# Patient Record
Sex: Male | Born: 1960 | Race: White | Hispanic: No | Marital: Married | Smoking: Heavy tobacco smoker
Health system: Southern US, Community
[De-identification: ages and names within clinical notes are randomized; demographics above are authoritative.]

## PROBLEM LIST (undated history)

## (undated) DIAGNOSIS — I10 Essential (primary) hypertension: Secondary | ICD-10-CM

## (undated) DIAGNOSIS — E119 Type 2 diabetes mellitus without complications: Secondary | ICD-10-CM

## (undated) DIAGNOSIS — T7840XA Allergy, unspecified, initial encounter: Secondary | ICD-10-CM

## (undated) DIAGNOSIS — J449 Chronic obstructive pulmonary disease, unspecified: Secondary | ICD-10-CM

## (undated) DIAGNOSIS — E079 Disorder of thyroid, unspecified: Secondary | ICD-10-CM

## (undated) DIAGNOSIS — I639 Cerebral infarction, unspecified: Secondary | ICD-10-CM

## (undated) DIAGNOSIS — I219 Acute myocardial infarction, unspecified: Secondary | ICD-10-CM

## (undated) DIAGNOSIS — I509 Heart failure, unspecified: Secondary | ICD-10-CM

## (undated) DIAGNOSIS — R569 Unspecified convulsions: Secondary | ICD-10-CM

## (undated) DIAGNOSIS — N189 Chronic kidney disease, unspecified: Secondary | ICD-10-CM

## (undated) DIAGNOSIS — Q2112 Patent foramen ovale: Secondary | ICD-10-CM

## (undated) DIAGNOSIS — N200 Calculus of kidney: Secondary | ICD-10-CM

## (undated) HISTORY — DX: Cerebral infarction, unspecified: I63.9

## (undated) HISTORY — PX: BACK SURGERY: SHX140

## (undated) HISTORY — DX: Heart failure, unspecified: I50.9

## (undated) HISTORY — DX: Chronic kidney disease, unspecified: N18.9

## (undated) HISTORY — DX: Acute myocardial infarction, unspecified: I21.9

## (undated) HISTORY — DX: Patent foramen ovale: Q21.12

## (undated) HISTORY — DX: Essential (primary) hypertension: I10

## (undated) HISTORY — DX: Allergy, unspecified, initial encounter: T78.40XA

## (undated) HISTORY — DX: Type 2 diabetes mellitus without complications: E11.9

## (undated) HISTORY — DX: Unspecified convulsions: R56.9

## (undated) HISTORY — DX: Disorder of thyroid, unspecified: E07.9

## (undated) HISTORY — DX: Calculus of kidney: N20.0

## (undated) HISTORY — DX: Chronic obstructive pulmonary disease, unspecified: J44.9

---

## 2001-08-29 ENCOUNTER — Encounter: Payer: Self-pay | Admitting: Urology

## 2001-08-29 ENCOUNTER — Ambulatory Visit (HOSPITAL_COMMUNITY): Admission: RE | Admit: 2001-08-29 | Discharge: 2001-08-29 | Payer: Self-pay | Admitting: Urology

## 2003-06-13 ENCOUNTER — Ambulatory Visit (HOSPITAL_COMMUNITY): Admission: RE | Admit: 2003-06-13 | Discharge: 2003-06-14 | Payer: Self-pay | Admitting: Neurosurgery

## 2008-03-08 ENCOUNTER — Ambulatory Visit: Payer: Self-pay | Admitting: Gastroenterology

## 2008-03-08 DIAGNOSIS — F17200 Nicotine dependence, unspecified, uncomplicated: Secondary | ICD-10-CM | POA: Insufficient documentation

## 2008-03-08 DIAGNOSIS — K625 Hemorrhage of anus and rectum: Secondary | ICD-10-CM | POA: Insufficient documentation

## 2008-03-08 DIAGNOSIS — R1310 Dysphagia, unspecified: Secondary | ICD-10-CM | POA: Insufficient documentation

## 2008-03-08 DIAGNOSIS — K219 Gastro-esophageal reflux disease without esophagitis: Secondary | ICD-10-CM | POA: Insufficient documentation

## 2008-05-07 ENCOUNTER — Ambulatory Visit: Payer: Self-pay | Admitting: Gastroenterology

## 2008-05-14 ENCOUNTER — Ambulatory Visit: Payer: Self-pay | Admitting: Gastroenterology

## 2010-09-25 NOTE — Op Note (Signed)
[name], [name]                           ACCOUNT NO.:  [id number]   MEDICAL RECORD NO.:  [id number]                   PATIENT TYPE:  OIB   LOCATION:  2858                                 FACILITY:  [facility]   PHYSICIAN:  Coletta Memos, M.D.                  DATE OF BIRTH:  [birth year]   DATE OF PROCEDURE:  06/13/2003  DATE OF DISCHARGE:                                 OPERATIVE REPORT   PREOPERATIVE DIAGNOSIS:  Displaced disk, L4-5, bilateral.   POSTOPERATIVE DIAGNOSIS:  Displaced disk, L4-5, bilateral.   OPERATION PERFORMED:  Bilateral hemilaminectomies, L4.  Right L4-5  diskectomy with microdissection.   SURGEON:  Coletta Memos, M.D.   ASSISTANT:  Stefani Dama, M.D.   OPERATIVE FINDINGS:  A large free fragment in two pieces removed caudal to  the disk space.   ANESTHESIA:  General.   INDICATIONS FOR PROCEDURE:  Fernando Gardner is a 50 year old gentleman with a 15  year history of back pain.  He presented to my office with severe pain in  his back and left lower extremity.  MRI showed a large herniated disk at L4-  5 which was central in location but obviously affecting both sides.  I  therefore recommended and he agreed to undergo bilateral laminectomy and  possible diskectomy at L4-5.   DESCRIPTION OF PROCEDURE:  [name] was brought to the operating room,  intubated and placed under general anesthetic without difficulty.  He was  rolled prone onto a Wilson frame and all pressure points were properly  padded.  The skin was prepped and he was draped in a sterile fashion.  __________ a preoperative localizing film.  I infiltrated 9 mL 0.5%  lidocaine with 1:200,000 strength epinephrine into the proposed incision and  paraspinous musculature on both sides.  I opened the skin with a #10 blade  and took that down to the thoracolumbar fascia.  I then exposed the lamina  bilaterally at L4 and L5.  I took an x-ray and it confirmed my location.  I  performed semihemilaminectomies  of L4 bilaterally.  I removed the ligamentum  flavum and exposed the thecal sac bilaterally.  I started on the right side,  opened the disk space and removed significant amount of disk.  With the use  of nerve hooks, I was able to retrieve two large fragments of disk material  which were caudal to the disk space which I will assume were the fragments  that I saw on the MRI.  Using the same blunt tip probe, I again probed  caudal to the disk space and was not able to find any disk material.  I also  explored with Dr. Danielle Dess, the left side and did not find any fragments that  came out.  The disk space felt rather flat considering I removed a  significant amount of disk from the disk space and had removed  two free  fragments from that caudal area.  I did not feel it was necessary to open  the disk space at L4-5.  I then irrigated the wound.  I then closed the  wound in layered fashion, using Vicryl sutures.  Subcutaneous tissue  reapproximated with 0 Vicryl sutures.  Dermabond was used for a sterile  dressing.  The patient tolerated the procedure well, awakening, moving all  extremities without difficulty.                                               Coletta Memos, M.D.   KC/MEDQ  D:  06/13/2003  T:  06/14/2003  Job:  [id number]

## 2013-09-17 ENCOUNTER — Encounter: Payer: Self-pay | Admitting: Family Medicine

## 2013-09-17 ENCOUNTER — Ambulatory Visit (INDEPENDENT_AMBULATORY_CARE_PROVIDER_SITE_OTHER): Payer: BC Managed Care – PPO | Admitting: Family Medicine

## 2013-09-17 ENCOUNTER — Ambulatory Visit (INDEPENDENT_AMBULATORY_CARE_PROVIDER_SITE_OTHER): Payer: BC Managed Care – PPO

## 2013-09-17 VITALS — BP 141/93 | HR 57 | Temp 98.8°F | Wt 181.8 lb

## 2013-09-17 DIAGNOSIS — M549 Dorsalgia, unspecified: Secondary | ICD-10-CM

## 2013-09-17 MED ORDER — CYCLOBENZAPRINE HCL 10 MG PO TABS: 10.0000 mg | ORAL_TABLET | Freq: Three times a day (TID) | ORAL | Status: DC | PRN

## 2013-09-17 MED ORDER — PREDNISONE 50 MG PO TABS
ORAL_TABLET | ORAL | Status: DC
Start: 2013-09-17 — End: 2014-05-24

## 2013-09-17 NOTE — Progress Notes (Signed)
   Subjective:    Patient ID: Fernando Gardner, male    DOB: 21-Jul-1960, 53 y.o.   MRN: [id number]  HPI Patient presents today with chief complaint of low back pain. States that he has had low back pain for the past 3-4 months. Initially to his back in February. Baseline history of lumbar disc herniation status post repair several years ago. Has had some radicular symptoms down into his right thigh. Patient works Comptroller. No bowel or bladder anesthesia. Pain is worse with back flexion and extension.    Review of Systems  All other systems reviewed and are negative.      Objective:   Physical Exam  Constitutional: He appears well-developed and well-nourished.  HENT:  Head: Normocephalic and atraumatic.  Eyes: Conjunctivae are normal. Pupils are equal, round, and reactive to light.  Neck: Normal range of motion. Neck supple.  Cardiovascular: Normal rate and regular rhythm.   Pulmonary/Chest: Effort normal and breath sounds normal.  Abdominal: Soft.  Musculoskeletal:  Mild lumbar TTP  Mild pain with lumbar flexion and extension Neurovascularly intact distally    Neurological: He is alert.  Skin: Skin is warm.  [facility] reading (PRIMARY) by  Dr. Ernestina Patches  L spine xray preliminarily negative for any fracture or dislocation                              Assessment & Plan:  Back pain - Plan: DG Lumbar Spine Complete, predniSONE (DELTASONE) 50 MG tablet, cyclobenzaprine (FLEXERIL) 10 MG tablet   Likely mild lumbar strain  Xrays preliminarily WNL  Will place on short course of prednisone and flexeril.  Discussed general care and [facility] red flags  Follow up as needed.

## 2013-09-24 ENCOUNTER — Telehealth: Payer: Self-pay | Admitting: Family Medicine

## 2013-09-24 DIAGNOSIS — M549 Dorsalgia, unspecified: Secondary | ICD-10-CM

## 2013-09-25 NOTE — Telephone Encounter (Signed)
That is fine.  Please place referral to ortho.  Thank you

## 2013-09-26 NOTE — Telephone Encounter (Signed)
Referral put in.

## 2013-10-11 ENCOUNTER — Encounter: Payer: Self-pay | Admitting: Gastroenterology

## 2013-11-15 ENCOUNTER — Other Ambulatory Visit: Payer: Self-pay | Admitting: Neurosurgery

## 2013-11-15 DIAGNOSIS — M5416 Radiculopathy, lumbar region: Secondary | ICD-10-CM

## 2013-11-26 ENCOUNTER — Other Ambulatory Visit: Payer: Self-pay | Admitting: Neurosurgery

## 2013-11-26 DIAGNOSIS — Z139 Encounter for screening, unspecified: Secondary | ICD-10-CM

## 2013-11-28 ENCOUNTER — Ambulatory Visit (INDEPENDENT_AMBULATORY_CARE_PROVIDER_SITE_OTHER): Payer: BC Managed Care – PPO

## 2013-11-28 DIAGNOSIS — M5126 Other intervertebral disc displacement, lumbar region: Secondary | ICD-10-CM

## 2013-11-28 DIAGNOSIS — Z139 Encounter for screening, unspecified: Secondary | ICD-10-CM

## 2013-11-28 DIAGNOSIS — M5416 Radiculopathy, lumbar region: Secondary | ICD-10-CM

## 2013-11-28 MED ORDER — GADOBENATE DIMEGLUMINE 529 MG/ML IV SOLN: 17.0000 mL | Freq: Once | INTRAVENOUS | Status: AC | PRN

## 2014-05-24 ENCOUNTER — Ambulatory Visit (INDEPENDENT_AMBULATORY_CARE_PROVIDER_SITE_OTHER): Payer: BLUE CROSS/BLUE SHIELD | Admitting: Family Medicine

## 2014-05-24 ENCOUNTER — Encounter: Payer: Self-pay | Admitting: Family Medicine

## 2014-05-24 VITALS — BP 147/95 | HR 68 | Temp 97.5°F | Ht 73.0 in | Wt 191.8 lb

## 2014-05-24 DIAGNOSIS — N200 Calculus of kidney: Secondary | ICD-10-CM

## 2014-05-24 LAB — POCT UA - MICROSCOPIC ONLY
Bacteria, U Microscopic: NEGATIVE
Casts, Ur, LPF, POC: NEGATIVE
Crystals, Ur, HPF, POC: NEGATIVE
Mucus, UA: NEGATIVE
WBC, Ur, HPF, POC: NEGATIVE
Yeast, UA: NEGATIVE

## 2014-05-24 LAB — POCT URINALYSIS DIPSTICK
Bilirubin, UA: NEGATIVE
Glucose, UA: NEGATIVE
Ketones, UA: NEGATIVE
Leukocytes, UA: NEGATIVE
Nitrite, UA: NEGATIVE
Protein, UA: NEGATIVE
Spec Grav, UA: <=1.005
Urobilinogen, UA: NEGATIVE
pH, UA: 6

## 2014-05-24 MED ORDER — TAMSULOSIN HCL 0.4 MG PO CAPS: 0.4000 mg | ORAL_CAPSULE | Freq: Every day | ORAL | Status: DC

## 2014-05-24 MED ORDER — HYDROCODONE-ACETAMINOPHEN 5-325 MG PO TABS: 1.0000 | ORAL_TABLET | Freq: Four times a day (QID) | ORAL | Status: DC | PRN

## 2014-05-24 NOTE — Progress Notes (Signed)
   Subjective:    Patient ID: Fernando Gardner, male    DOB: 1961/03/28, 54 y.o.   MRN: [id number]  HPI C/o possible kidney stone sx's.  He is having severe left abdominal discomfort and bloody urine.  He was seen in the ED a month ago and was told he has 71mm left kidney stone and needed a urology referral.  Review of Systems  Constitutional: Negative for fever.  HENT: Negative for ear pain.   Eyes: Negative for discharge.  Respiratory: Negative for cough.   Cardiovascular: Negative for chest pain.  Gastrointestinal: Negative for abdominal distention.  Endocrine: Negative for polyuria.  Genitourinary: Negative for difficulty urinating.  Musculoskeletal: Negative for gait problem and neck pain.  Skin: Negative for color change and rash.  Neurological: Negative for speech difficulty and headaches.  Psychiatric/Behavioral: Negative for agitation.       Objective:    There were no vitals taken for this visit. Physical Exam  Constitutional: He is oriented to person, place, and time. He appears well-developed and well-nourished.  HENT:  Head: Normocephalic and atraumatic.  Mouth/Throat: Oropharynx is clear and moist.  Eyes: Pupils are equal, round, and reactive to light.  Neck: Normal range of motion. Neck supple.  Cardiovascular: Normal rate and regular rhythm.   No murmur heard. Pulmonary/Chest: Effort normal and breath sounds normal.  Abdominal: Soft. Bowel sounds are normal. There is no tenderness.  Neurological: He is alert and oriented to person, place, and time.  Skin: Skin is warm and dry.  Psychiatric: He has a normal mood and affect.          Assessment & Plan:     ICD-9-CM ICD-10-CM   1. Kidney stones 592.0 N20.0 POCT urinalysis dipstick     POCT UA - Microscopic Only     No Follow-up on file.  Lysbeth Penner FNP

## 2014-08-12 ENCOUNTER — Encounter: Payer: Self-pay | Admitting: Gastroenterology

## 2014-09-12 ENCOUNTER — Other Ambulatory Visit: Payer: Self-pay | Admitting: Family Medicine

## 2015-06-02 ENCOUNTER — Encounter: Payer: Self-pay | Admitting: Gastroenterology

## 2015-07-27 IMAGING — CR DG LUMBAR SPINE COMPLETE 4+V
2 series · 2 of 2 positions shown · non-contrast
Comparison: None.

CLINICAL DATA: Back pain

EXAM:
LUMBAR SPINE - COMPLETE 4+ VIEW

[view not recorded (1 of 2)]
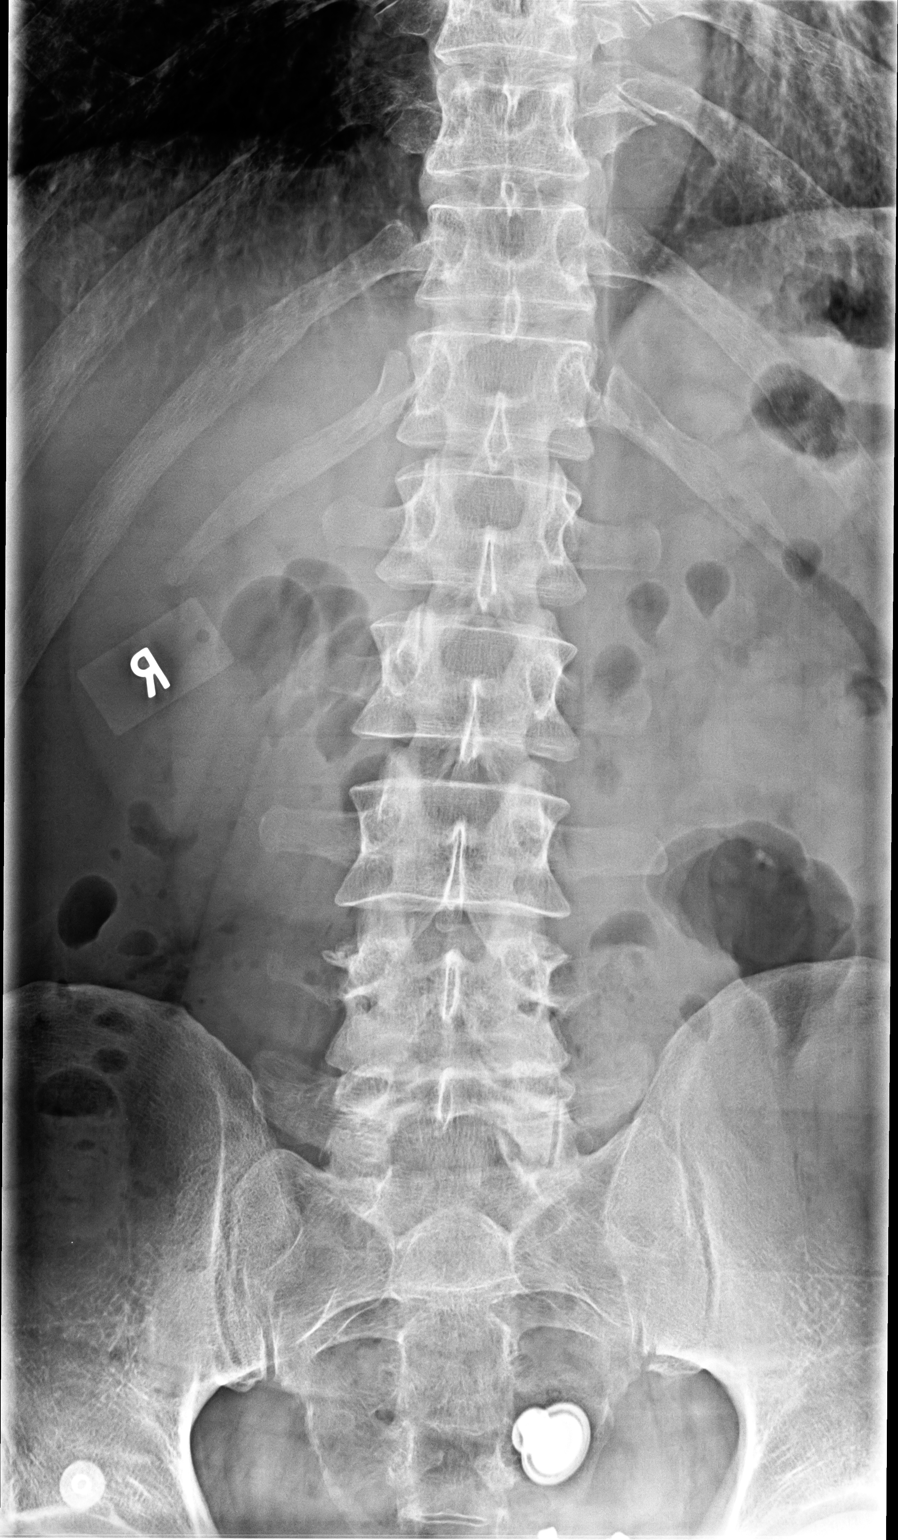

[view not recorded (2 of 2)]
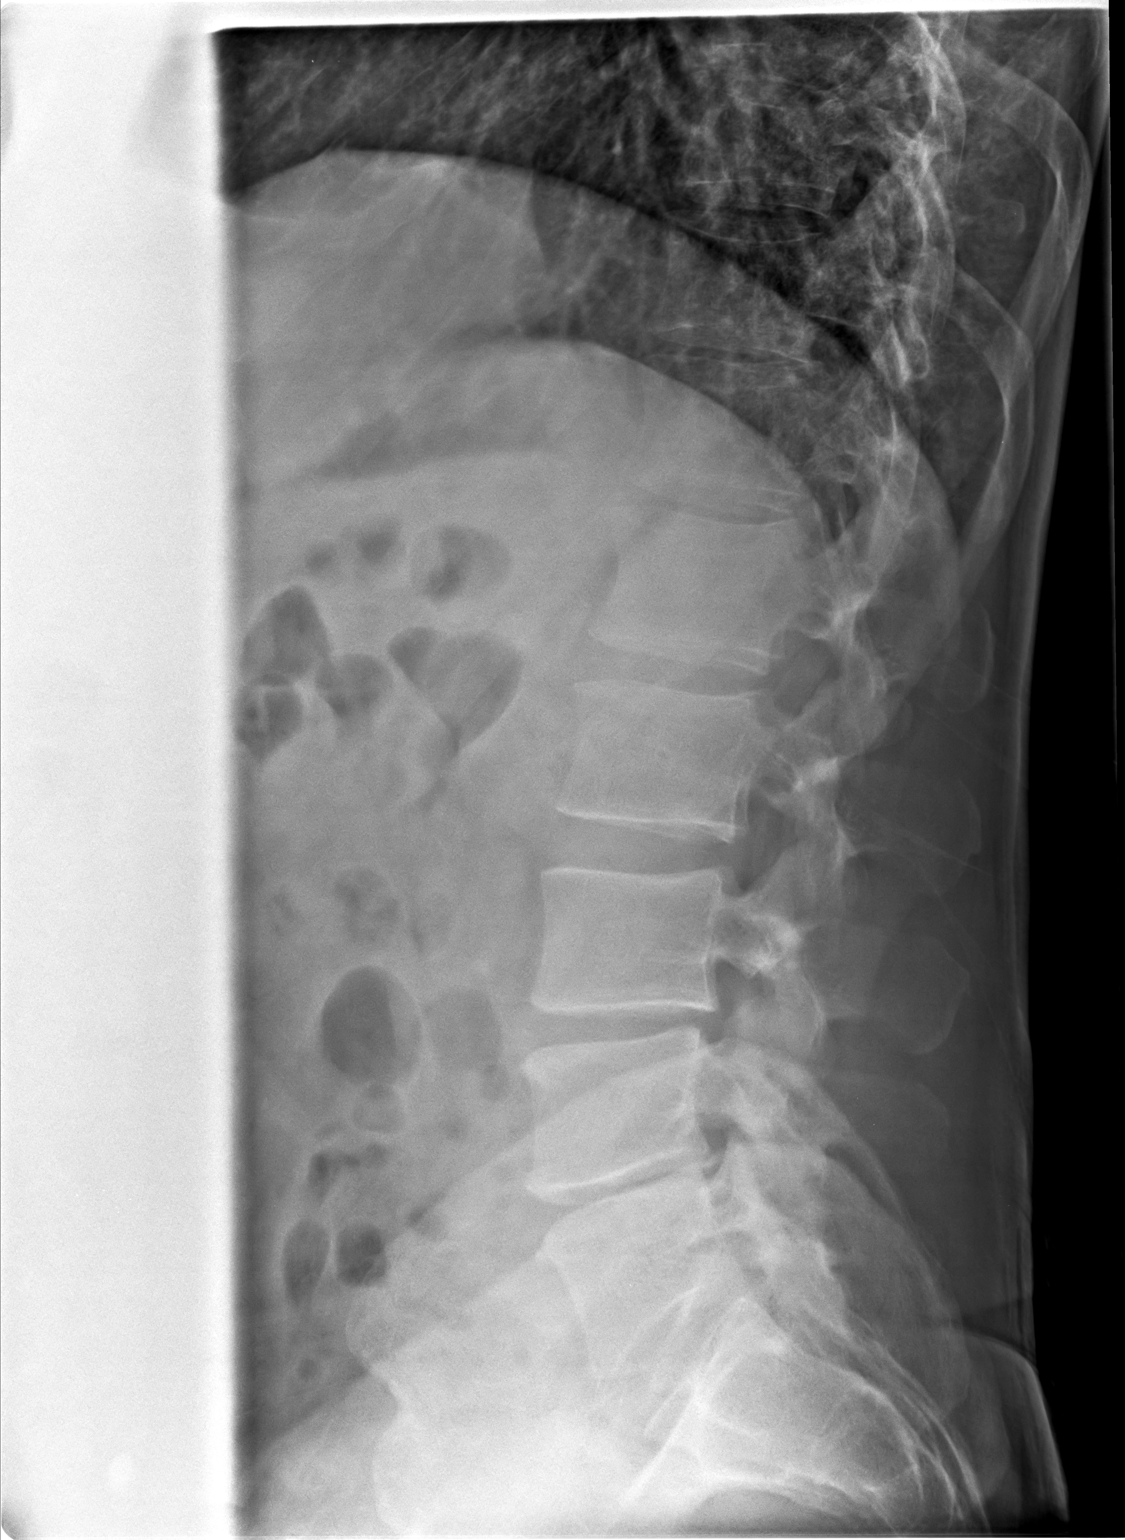

[2 of 2 positions shown; findings below may reference images not displayed]

FINDINGS: Two views of lumbar spine submitted. No acute fracture or
subluxation. There is disc space flattening with mild anterior
spurring at L4-L5 level. Minimal disc space flattening at L5-S1
level. Mild disc space flattening at L3-L4 level.
IMPRESSION: No acute fracture or subluxation. Mild degenerative changes as
described above.

## 2016-04-30 DIAGNOSIS — H578 Other specified disorders of eye and adnexa: Secondary | ICD-10-CM | POA: Diagnosis not present

## 2016-10-02 DIAGNOSIS — R319 Hematuria, unspecified: Secondary | ICD-10-CM | POA: Diagnosis not present

## 2016-10-02 DIAGNOSIS — N200 Calculus of kidney: Secondary | ICD-10-CM | POA: Diagnosis not present

## 2016-10-02 DIAGNOSIS — R109 Unspecified abdominal pain: Secondary | ICD-10-CM | POA: Diagnosis not present

## 2016-10-06 DIAGNOSIS — N201 Calculus of ureter: Secondary | ICD-10-CM | POA: Diagnosis not present

## 2016-10-06 DIAGNOSIS — C7492 Malignant neoplasm of unspecified part of left adrenal gland: Secondary | ICD-10-CM | POA: Diagnosis not present

## 2016-10-06 DIAGNOSIS — N202 Calculus of kidney with calculus of ureter: Secondary | ICD-10-CM | POA: Diagnosis not present

## 2016-10-06 DIAGNOSIS — R1084 Generalized abdominal pain: Secondary | ICD-10-CM | POA: Diagnosis not present

## 2016-10-06 DIAGNOSIS — C7491 Malignant neoplasm of unspecified part of right adrenal gland: Secondary | ICD-10-CM | POA: Diagnosis not present

## 2016-10-06 DIAGNOSIS — N132 Hydronephrosis with renal and ureteral calculous obstruction: Secondary | ICD-10-CM | POA: Diagnosis not present

## 2016-10-06 DIAGNOSIS — R109 Unspecified abdominal pain: Secondary | ICD-10-CM | POA: Diagnosis not present

## 2016-10-06 DIAGNOSIS — Z87442 Personal history of urinary calculi: Secondary | ICD-10-CM | POA: Diagnosis not present

## 2016-10-12 DIAGNOSIS — Z8673 Personal history of transient ischemic attack (TIA), and cerebral infarction without residual deficits: Secondary | ICD-10-CM | POA: Diagnosis not present

## 2016-10-12 DIAGNOSIS — N132 Hydronephrosis with renal and ureteral calculous obstruction: Secondary | ICD-10-CM | POA: Diagnosis not present

## 2016-10-12 DIAGNOSIS — N201 Calculus of ureter: Secondary | ICD-10-CM | POA: Diagnosis not present

## 2016-10-12 DIAGNOSIS — N200 Calculus of kidney: Secondary | ICD-10-CM | POA: Diagnosis not present

## 2016-10-12 DIAGNOSIS — Z79899 Other long term (current) drug therapy: Secondary | ICD-10-CM | POA: Diagnosis not present

## 2016-10-12 DIAGNOSIS — K219 Gastro-esophageal reflux disease without esophagitis: Secondary | ICD-10-CM | POA: Diagnosis not present

## 2016-10-12 DIAGNOSIS — F17210 Nicotine dependence, cigarettes, uncomplicated: Secondary | ICD-10-CM | POA: Diagnosis not present

## 2016-10-20 DIAGNOSIS — N200 Calculus of kidney: Secondary | ICD-10-CM | POA: Diagnosis not present

## 2016-11-19 DIAGNOSIS — Z466 Encounter for fitting and adjustment of urinary device: Secondary | ICD-10-CM | POA: Diagnosis not present

## 2016-11-19 DIAGNOSIS — N200 Calculus of kidney: Secondary | ICD-10-CM | POA: Diagnosis not present

## 2016-11-19 DIAGNOSIS — E278 Other specified disorders of adrenal gland: Secondary | ICD-10-CM | POA: Diagnosis not present

## 2016-11-19 DIAGNOSIS — N132 Hydronephrosis with renal and ureteral calculous obstruction: Secondary | ICD-10-CM | POA: Diagnosis not present

## 2016-11-19 DIAGNOSIS — N1330 Unspecified hydronephrosis: Secondary | ICD-10-CM | POA: Diagnosis not present

## 2016-11-24 DIAGNOSIS — N201 Calculus of ureter: Secondary | ICD-10-CM | POA: Diagnosis not present

## 2016-11-24 DIAGNOSIS — Z960 Presence of urogenital implants: Secondary | ICD-10-CM | POA: Diagnosis not present

## 2016-11-24 DIAGNOSIS — N200 Calculus of kidney: Secondary | ICD-10-CM | POA: Diagnosis not present

## 2016-12-09 DIAGNOSIS — I088 Other rheumatic multiple valve diseases: Secondary | ICD-10-CM | POA: Diagnosis not present

## 2016-12-09 DIAGNOSIS — R911 Solitary pulmonary nodule: Secondary | ICD-10-CM | POA: Diagnosis not present

## 2016-12-09 DIAGNOSIS — K219 Gastro-esophageal reflux disease without esophagitis: Secondary | ICD-10-CM | POA: Diagnosis not present

## 2016-12-09 DIAGNOSIS — R931 Abnormal findings on diagnostic imaging of heart and coronary circulation: Secondary | ICD-10-CM | POA: Diagnosis not present

## 2016-12-09 DIAGNOSIS — I639 Cerebral infarction, unspecified: Secondary | ICD-10-CM | POA: Diagnosis not present

## 2016-12-09 DIAGNOSIS — Z79899 Other long term (current) drug therapy: Secondary | ICD-10-CM | POA: Diagnosis not present

## 2016-12-09 DIAGNOSIS — R29701 NIHSS score 1: Secondary | ICD-10-CM | POA: Diagnosis not present

## 2016-12-09 DIAGNOSIS — I63512 Cerebral infarction due to unspecified occlusion or stenosis of left middle cerebral artery: Secondary | ICD-10-CM | POA: Diagnosis not present

## 2016-12-09 DIAGNOSIS — R531 Weakness: Secondary | ICD-10-CM | POA: Diagnosis not present

## 2016-12-09 DIAGNOSIS — Z87442 Personal history of urinary calculi: Secondary | ICD-10-CM | POA: Diagnosis not present

## 2016-12-09 DIAGNOSIS — R29898 Other symptoms and signs involving the musculoskeletal system: Secondary | ICD-10-CM | POA: Diagnosis not present

## 2016-12-09 DIAGNOSIS — R29818 Other symptoms and signs involving the nervous system: Secondary | ICD-10-CM | POA: Diagnosis not present

## 2016-12-09 DIAGNOSIS — R9082 White matter disease, unspecified: Secondary | ICD-10-CM | POA: Diagnosis not present

## 2016-12-09 DIAGNOSIS — R202 Paresthesia of skin: Secondary | ICD-10-CM | POA: Diagnosis not present

## 2016-12-10 DIAGNOSIS — R931 Abnormal findings on diagnostic imaging of heart and coronary circulation: Secondary | ICD-10-CM | POA: Diagnosis not present

## 2016-12-10 DIAGNOSIS — I088 Other rheumatic multiple valve diseases: Secondary | ICD-10-CM | POA: Diagnosis not present

## 2016-12-10 DIAGNOSIS — I639 Cerebral infarction, unspecified: Secondary | ICD-10-CM | POA: Diagnosis not present

## 2016-12-10 DIAGNOSIS — R911 Solitary pulmonary nodule: Secondary | ICD-10-CM | POA: Diagnosis not present

## 2016-12-15 ENCOUNTER — Ambulatory Visit (INDEPENDENT_AMBULATORY_CARE_PROVIDER_SITE_OTHER): Payer: BLUE CROSS/BLUE SHIELD | Admitting: Family Medicine

## 2016-12-15 ENCOUNTER — Encounter (INDEPENDENT_AMBULATORY_CARE_PROVIDER_SITE_OTHER): Payer: Self-pay

## 2016-12-15 ENCOUNTER — Encounter: Payer: Self-pay | Admitting: Family Medicine

## 2016-12-15 VITALS — BP 134/85 | HR 54 | Temp 97.2°F | Ht 73.0 in | Wt 190.0 lb

## 2016-12-15 DIAGNOSIS — Z8673 Personal history of transient ischemic attack (TIA), and cerebral infarction without residual deficits: Secondary | ICD-10-CM

## 2016-12-15 DIAGNOSIS — Z09 Encounter for follow-up examination after completed treatment for conditions other than malignant neoplasm: Secondary | ICD-10-CM | POA: Diagnosis not present

## 2016-12-16 ENCOUNTER — Encounter: Payer: Self-pay | Admitting: Family Medicine

## 2016-12-16 DIAGNOSIS — I63512 Cerebral infarction due to unspecified occlusion or stenosis of left middle cerebral artery: Secondary | ICD-10-CM | POA: Diagnosis not present

## 2016-12-16 DIAGNOSIS — Z720 Tobacco use: Secondary | ICD-10-CM | POA: Diagnosis not present

## 2016-12-16 DIAGNOSIS — E785 Hyperlipidemia, unspecified: Secondary | ICD-10-CM | POA: Diagnosis not present

## 2016-12-16 DIAGNOSIS — I10 Essential (primary) hypertension: Secondary | ICD-10-CM | POA: Diagnosis not present

## 2016-12-19 NOTE — Progress Notes (Signed)
BP 134/85   Pulse (!) 54   Temp (!) 97.2 F (36.2 C) (Oral)   Ht 6\' 1"  (1.854 m)   Wt 190 lb (86.2 kg)   BMI 25.07 kg/m    Subjective:    Patient ID: Fernando Gardner, male    DOB: 06/15/1960, 56 y.o.   MRN: [id number]  HPI: [name] is a 56 y.o. male presenting on 12/15/2016 for Hospitalization Follow-up (pt here today following up after being admitted at [facility] for TIA.)   HPI Hospital follow-up for TIA Patient is coming in today for hospital follow-up for TIA. He was admitted on 12/09/2016. He was found to have a TIA at that point which then resolved quickly and he denies any residual symptoms. He went to the hospital with symptoms of right arm and leg numbness and weakness. He says those are completely resolves. He denied any speech difficulties or facial difficulties at the time or since. He does admit that he is still smoking but is down to half a pack a day from over 1-1/2 packs per day.  Relevant past medical, surgical, family and social history reviewed and updated as indicated. Interim medical history since our last visit reviewed. Allergies and medications reviewed and updated.  Review of Systems  Constitutional: Negative for chills and fever.  Eyes: Negative for discharge.  Respiratory: Negative for shortness of breath and wheezing.   Cardiovascular: Negative for chest pain and leg swelling.  Musculoskeletal: Negative for back pain and gait problem.  Skin: Negative for rash.  Neurological: Positive for weakness and numbness. Negative for dizziness and light-headedness.  All other systems reviewed and are negative.   Per HPI unless specifically indicated above   Allergies as of 12/15/2016   No Known Allergies     Medication List       Accurate as of 12/15/16 11:59 PM. Always use your most recent med list.          aspirin 81 MG chewable tablet Chew 81 mg by mouth every morning.   atorvastatin 40 MG tablet Commonly known as:  LIPITOR Take 40 mg by mouth  at bedtime.   Potassium Citrate 15 MEQ (1620 MG) Tbcr Take 30 mEq by mouth 2 (two) times daily.          Objective:    BP 134/85   Pulse (!) 54   Temp (!) 97.2 F (36.2 C) (Oral)   Ht 6\' 1"  (1.854 m)   Wt 190 lb (86.2 kg)   BMI 25.07 kg/m   Wt Readings from Last 3 Encounters:  12/15/16 190 lb (86.2 kg)  05/24/14 191 lb 12.8 oz (87 kg)  09/17/13 181 lb 12.8 oz (82.5 kg)    Physical Exam  Constitutional: He is oriented to person, place, and time. He appears well-developed and well-nourished. No distress.  Eyes: Conjunctivae are normal. No scleral icterus.  Cardiovascular: Normal rate, regular rhythm, normal heart sounds and intact distal pulses.   No murmur heard. Pulmonary/Chest: Effort normal and breath sounds normal. No respiratory distress. He has no wheezes.  Musculoskeletal: Normal range of motion. He exhibits no edema.  Neurological: He is alert and oriented to person, place, and time. He has normal strength. No cranial nerve deficit or sensory deficit. He exhibits normal muscle tone. Coordination and gait normal.  Skin: Skin is warm and dry. No rash noted. He is not diaphoretic.  Psychiatric: He has a normal mood and affect. His behavior is normal.  Nursing note and vitals  reviewed.       Assessment & Plan:   Problem List Items Addressed This Visit    None    Visit Diagnoses    History of TIA (transient ischemic attack)    [facility] discharge follow-up         As the computer systems are down currently I'm unable to visualize his hospital follow-up labs at this point but he does have follow-up with the stroke clinic tomorrow and they will manage more closely.   He has been started on aspirin and Lipitor and potassium at the hospital.  Follow up plan: Return if symptoms worsen or fail to improve.  Counseling provided for all of the vaccine components No orders of the defined types were placed in this encounter.   Caryl Pina,  MD [facility] 12/15/2016, 4:48 PM

## 2017-01-03 ENCOUNTER — Encounter: Payer: Self-pay | Admitting: Family Medicine

## 2017-01-03 ENCOUNTER — Ambulatory Visit (INDEPENDENT_AMBULATORY_CARE_PROVIDER_SITE_OTHER): Payer: BLUE CROSS/BLUE SHIELD | Admitting: Family Medicine

## 2017-01-03 VITALS — BP 125/83 | HR 57 | Temp 97.6°F | Ht 73.0 in | Wt 192.0 lb

## 2017-01-03 DIAGNOSIS — M7712 Lateral epicondylitis, left elbow: Secondary | ICD-10-CM | POA: Diagnosis not present

## 2017-01-03 MED ORDER — METHYLPREDNISOLONE ACETATE 80 MG/ML IJ SUSP
40.0000 mg | Freq: Once | INTRAMUSCULAR | Status: AC
  Administered 2017-01-03: 40 mg via INTRAMUSCULAR

## 2017-01-03 NOTE — Progress Notes (Signed)
BP 125/83 (BP Location: Left Arm)   Pulse (!) 57   Temp 97.6 F (36.4 C) (Oral)   Ht 6\' 1"  (1.854 m)   Wt 192 lb (87.1 kg)   BMI 25.33 kg/m    Subjective:    Patient ID: Fernando Gardner, male    DOB: Mar 06, 1961, 56 y.o.   MRN: [id number]  HPI: Fernando Gardner is a 56 y.o. male presenting on 01/03/2017 for Hospitalization Follow-up (CVA / stroke) and left elbow pain (wants injection)   HPI Left lateral elbow pain Patient has been having continued left lateral leg pain is been going on for at least a month and a half to 2 months. He says it hurts more when he driving and has his arm extended position regional for the will. He says it does hurt on the lateral aspect of the arm. 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 He denies any fevers or chills or numbness or weakness. He has been trying conservative measures including both NSAIDs and arm bands but does not feel like it's gotten any better with those.  Relevant past medical, surgical, family and social history reviewed and updated as indicated. Interim medical history since our last visit reviewed. Allergies and medications reviewed and updated.  Review of Systems  Constitutional: Negative for chills and fever.  Respiratory: Negative for shortness of breath and wheezing.   Cardiovascular: Negative for chest pain and leg swelling.  Musculoskeletal: Positive for arthralgias. Negative for back pain and gait problem.  Skin: Negative for color change and rash.  All other systems reviewed and are negative.   Per HPI unless specifically indicated above        Objective:    BP 125/83 (BP Location: Left Arm)   Pulse (!) 57   Temp 97.6 F (36.4 C) (Oral)   Ht 6\' 1"  (1.854 m)   Wt 192 lb (87.1 kg)   BMI 25.33 kg/m   Wt Readings from Last 3 Encounters:  01/03/17 192 lb (87.1 kg)  12/15/16 190 lb (86.2 kg)  05/24/14 191 lb 12.8 oz (87 kg)    Physical Exam    Constitutional: He is oriented to person, place, and time. He appears well-developed and well-nourished. No distress.  Eyes: Conjunctivae are normal. No scleral icterus.  Cardiovascular: Normal rate, regular rhythm, normal heart sounds and intact distal pulses.   No murmur heard. Pulmonary/Chest: Effort normal and breath sounds normal. No respiratory distress. He has no wheezes.  Musculoskeletal: Normal range of motion. He exhibits no edema.       Left elbow: He exhibits normal range of motion, no swelling and no effusion. Tenderness found. Lateral epicondyle tenderness noted.  Neurological: He is alert and oriented to person, place, and time. Coordination normal.  Skin: Skin is warm and dry. No rash noted. He is not diaphoretic.  Psychiatric: He has a normal mood and affect. His behavior is normal.  Nursing note and vitals reviewed.   Left lateral epicondylitis injection: Consent form signed. Risk factors of bleeding and infection discussed with patient and patient is agreeable towards injection. Patient prepped with Betadine. Lateral approach towards injection used. Injected 80 mg of Depo-Medrol and 1 mL of 2% lidocaine. Patient tolerated procedure well and no side effects from noted. Minimal to no bleeding. Simple bandage applied after.     Assessment & Plan:   Problem List Items Addressed This Visit    None  Visit Diagnoses    Lateral epicondylitis of left elbow    -  Primary   Relevant Medications   methylPREDNISolone acetate (DEPO-MEDROL) injection 40 mg (Start on 01/03/2017  3:30 PM)       Follow up plan: Return if symptoms worsen or fail to improve.  Counseling provided for all of the vaccine components No orders of the defined types were placed in this encounter.   Caryl Pina, MD [facility] 01/03/2017, 3:20 PM

## 2017-02-02 ENCOUNTER — Other Ambulatory Visit: Payer: Self-pay | Admitting: Family Medicine

## 2017-02-02 NOTE — Telephone Encounter (Signed)
Seen 12/15/16  Dr Dettinger  No lipid in [employer]

## 2017-03-02 ENCOUNTER — Other Ambulatory Visit: Payer: Self-pay | Admitting: Family Medicine

## 2017-06-30 DIAGNOSIS — I10 Essential (primary) hypertension: Secondary | ICD-10-CM | POA: Diagnosis not present

## 2017-06-30 DIAGNOSIS — N200 Calculus of kidney: Secondary | ICD-10-CM | POA: Diagnosis not present

## 2017-07-12 DIAGNOSIS — N200 Calculus of kidney: Secondary | ICD-10-CM | POA: Diagnosis not present

## 2018-03-02 ENCOUNTER — Encounter: Payer: Self-pay | Admitting: Family Medicine

## 2018-03-02 ENCOUNTER — Ambulatory Visit: Payer: BLUE CROSS/BLUE SHIELD | Admitting: Family Medicine

## 2018-03-02 VITALS — BP 155/92 | HR 55 | Temp 97.4°F | Ht 73.0 in | Wt 188.8 lb

## 2018-03-02 DIAGNOSIS — R0781 Pleurodynia: Secondary | ICD-10-CM

## 2018-03-02 DIAGNOSIS — J410 Simple chronic bronchitis: Secondary | ICD-10-CM | POA: Diagnosis not present

## 2018-03-02 MED ORDER — CYCLOBENZAPRINE HCL 10 MG PO TABS: 10.0000 mg | ORAL_TABLET | Freq: Three times a day (TID) | ORAL | 0 refills | Status: DC | PRN

## 2018-03-02 MED ORDER — BENZONATATE 100 MG PO CAPS: 100.0000 mg | ORAL_CAPSULE | Freq: Three times a day (TID) | ORAL | 0 refills | Status: DC | PRN

## 2018-03-02 NOTE — Progress Notes (Signed)
BP (!) 155/92   Pulse (!) 55   Temp (!) 97.4 F (36.3 C) (Oral)   Ht 6\' 1"  (1.854 m)   Wt 188 lb 12.8 oz (85.6 kg)   BMI 24.91 kg/m    Subjective:    Patient ID: Fernando Gardner, male    DOB: [birth year], 57 y.o.   MRN: [id number]  HPI: Fernando Gardner is a 57 y.o. male presenting on 03/02/2018 for Cough (Patient states he coughs all the time from smoking and thinks he pulled a muscle yesterday from coughing.) and Flank Pain (right)   HPI Cough and congestion and rib pain Patient is coming in with complaints of cough and congestion and just developed yesterday where he was coughing so much she feels like he pulled a muscle on his right side the lower ribs and upper abdomen.  He says the pain is worsened by rotating and movement and coughing and deep breathing.  He denies any pain associated with eating or nausea or vomiting.  His cough has been the same that it always is and he does get these coughing spells especially in the morning when he wakes up.  He denies any fevers or chills or shortness of breath or wheezing.  Patient does admit that he still smoking a pack and 1/2 to 2 packs daily and he feels like he does not want to quit because he had both of his TIAs during time periods when he quit smoking  Relevant past medical, surgical, family and social history reviewed and updated as indicated. Interim medical history since our last visit reviewed. Allergies and medications reviewed and updated.  Review of Systems  Constitutional: Negative for chills and fever.  HENT: Positive for congestion.   Eyes: Negative for visual disturbance.  Respiratory: Positive for cough. Negative for shortness of breath and wheezing.   Cardiovascular: Negative for chest pain and leg swelling.  Musculoskeletal: Positive for myalgias. Negative for back pain and gait problem.  Skin: Negative for color change and rash.  All other systems reviewed and are negative.   Per HPI unless specifically indicated  above   Allergies as of 03/02/2018   No Known Allergies     Medication List        Accurate as of 03/02/18 10:48 AM. Always use your most recent med list.          benzonatate 100 MG capsule Commonly known as:  TESSALON Take 1 capsule (100 mg total) by mouth 3 (three) times daily as needed for cough.   cyclobenzaprine 10 MG tablet Commonly known as:  FLEXERIL Take 1 tablet (10 mg total) by mouth 3 (three) times daily as needed for muscle spasms.   Potassium Citrate 15 MEQ (1620 MG) Tbcr Take 30 mEq by mouth 2 (two) times daily.          Objective:    BP (!) 155/92   Pulse (!) 55   Temp (!) 97.4 F (36.3 C) (Oral)   Ht 6\' 1"  (1.854 m)   Wt 188 lb 12.8 oz (85.6 kg)   BMI 24.91 kg/m   Wt Readings from Last 3 Encounters:  03/02/18 188 lb 12.8 oz (85.6 kg)  01/03/17 192 lb (87.1 kg)  12/15/16 190 lb (86.2 kg)    Physical Exam  Constitutional: He is oriented to person, place, and time. He appears well-developed and well-nourished. No distress.  Eyes: Conjunctivae are normal. No scleral icterus.  Neck: Neck supple.  Cardiovascular: Normal rate, regular rhythm, normal  heart sounds and intact distal pulses.  No murmur heard. Pulmonary/Chest: Effort normal and breath sounds normal. No respiratory distress. He has no wheezes.  Musculoskeletal: He exhibits tenderness (Right lower rib pain).  Lymphadenopathy:    He has no cervical adenopathy.  Neurological: He is alert and oriented to person, place, and time. Coordination normal.  Skin: Skin is warm and dry. No rash noted. He is not diaphoretic.  Psychiatric: He has a normal mood and affect. His behavior is normal.  Nursing note and vitals reviewed.     Assessment & Plan:   Problem List Items Addressed This Visit    None    Visit Diagnoses    Simple chronic bronchitis ([place])    -  Primary   Relevant Medications   benzonatate (TESSALON PERLES) 100 MG capsule   cyclobenzaprine (FLEXERIL) 10 MG tablet   Rib pain  on right side       Likely muscular strain from all the coughing.   Relevant Medications   benzonatate (TESSALON PERLES) 100 MG capsule   cyclobenzaprine (FLEXERIL) 10 MG tablet      Gave sample for Incruse Ellipta and a muscle relaxer and Tessalon Perles, likely his COPD, patient has no desire to quit smoking at this point Follow up plan: Return if symptoms worsen or fail to improve.  Counseling provided for all of the vaccine components No orders of the defined types were placed in this encounter.   Caryl Pina, MD [facility] 03/02/2018, 10:48 AM

## 2018-12-06 ENCOUNTER — Encounter: Payer: Self-pay | Admitting: Family

## 2018-12-06 ENCOUNTER — Other Ambulatory Visit: Payer: Self-pay

## 2018-12-06 ENCOUNTER — Ambulatory Visit: Payer: BLUE CROSS/BLUE SHIELD | Admitting: Family

## 2018-12-06 VITALS — BP 149/95 | HR 51 | Temp 97.9°F | Ht 73.0 in | Wt 185.8 lb

## 2018-12-06 DIAGNOSIS — M5442 Lumbago with sciatica, left side: Secondary | ICD-10-CM | POA: Diagnosis not present

## 2018-12-06 MED ORDER — METHYLPREDNISOLONE ACETATE 80 MG/ML IJ SUSP
80.0000 mg | Freq: Once | INTRAMUSCULAR | Status: AC
  Administered 2018-12-06: 80 mg via INTRAMUSCULAR

## 2018-12-06 MED ORDER — DICLOFENAC SODIUM 75 MG PO TBEC: 75.0000 mg | DELAYED_RELEASE_TABLET | Freq: Two times a day (BID) | ORAL | 0 refills | Status: DC

## 2018-12-06 MED ORDER — KETOROLAC TROMETHAMINE 60 MG/2ML IM SOLN
60.0000 mg | Freq: Once | INTRAMUSCULAR | Status: AC
  Administered 2018-12-06: 17:00:00 60 mg via INTRAMUSCULAR

## 2018-12-06 MED ORDER — BACLOFEN 10 MG PO TABS: 10.0000 mg | ORAL_TABLET | Freq: Three times a day (TID) | ORAL | 0 refills | Status: DC

## 2018-12-06 NOTE — Patient Instructions (Signed)
Acute Back Pain, Adult Acute back pain is sudden and usually short-lived. It is often caused by an injury to the muscles and tissues in the back. The injury may result from:  A muscle or ligament getting overstretched or torn (strained). Ligaments are tissues that connect bones to each other. Lifting something improperly can cause a back strain.  Wear and tear (degeneration) of the spinal disks. Spinal disks are circular tissue that provides cushioning between the bones of the spine (vertebrae).  Twisting motions, such as while playing sports or doing yard work.  A hit to the back.  Arthritis. You may have a physical exam, lab tests, and imaging tests to find the cause of your pain. Acute back pain usually goes away with rest and home care. Follow these instructions at home: Managing pain, stiffness, and swelling  Take over-the-counter and prescription medicines only as told by your health care provider.  Your health care provider may recommend applying ice during the first 24-48 hours after your pain starts. To do this: ? Put ice in a plastic bag. ? Place a towel between your skin and the bag. ? Leave the ice on for 20 minutes, 2-3 times a day.  If directed, apply heat to the affected area as often as told by your health care provider. Use the heat source that your health care provider recommends, such as a moist heat pack or a heating pad. ? Place a towel between your skin and the heat source. ? Leave the heat on for 20-30 minutes. ? Remove the heat if your skin turns bright red. This is especially important if you are unable to feel pain, heat, or cold. You have a greater risk of getting burned. Activity   Do not stay in bed. Staying in bed for more than 1-2 days can delay your recovery.  Sit up and stand up straight. Avoid leaning forward when you sit, or hunching over when you stand. ? If you work at a desk, sit close to it so you do not need to lean over. Keep your chin tucked  in. Keep your neck drawn back, and keep your elbows bent at a right angle. Your arms should look like the letter "L." ? Sit high and close to the steering wheel when you drive. Add lower back (lumbar) support to your car seat, if needed.  Take short walks on even surfaces as soon as you are able. Try to increase the length of time you walk each day.  Do not sit, drive, or stand in one place for more than 30 minutes at a time. Sitting or standing for long periods of time can put stress on your back.  Do not drive or use heavy machinery while taking prescription pain medicine.  Use proper lifting techniques. When you bend and lift, use positions that put less stress on your back: ? Bend your knees. ? Keep the load close to your body. ? Avoid twisting.  Exercise regularly as told by your health care provider. Exercising helps your back heal faster and helps prevent back injuries by keeping muscles strong and flexible.  Work with a physical therapist to make a safe exercise program, as recommended by your health care provider. Do any exercises as told by your physical therapist. Lifestyle  Maintain a healthy weight. Extra weight puts stress on your back and makes it difficult to have good posture.  Avoid activities or situations that make you feel anxious or stressed. Stress and anxiety increase muscle   tension and can make back pain worse. Learn ways to manage anxiety and stress, such as through exercise. General instructions  Sleep on a firm mattress in a comfortable position. Try lying on your side with your knees slightly bent. If you lie on your back, put a pillow under your knees.  Follow your treatment plan as told by your health care provider. This may include: ? Cognitive or behavioral therapy. ? Acupuncture or massage therapy. ? Meditation or yoga. Contact a health care provider if:  You have pain that is not relieved with rest or medicine.  You have increasing pain going down  into your legs or buttocks.  Your pain does not improve after 2 weeks.  You have pain at night.  You lose weight without trying.  You have a fever or chills. Get help right away if:  You develop new bowel or bladder control problems.  You have unusual weakness or numbness in your arms or legs.  You develop nausea or vomiting.  You develop abdominal pain.  You feel faint. Summary  Acute back pain is sudden and usually short-lived.  Use proper lifting techniques. When you bend and lift, use positions that put less stress on your back.  Take over-the-counter and prescription medicines and apply heat or ice as directed by your health care provider. This information is not intended to replace advice given to you by your health care provider. Make sure you discuss any questions you have with your health care provider. Document Released: 04/26/2005 Document Revised: 08/15/2018 Document Reviewed: 12/08/2016 Elsevier Patient Education  2020 Elsevier Inc.  

## 2018-12-06 NOTE — Progress Notes (Signed)
Subjective:    Patient ID: Fernando Gardner, male    DOB: [birth year], 58 y.o.   MRN: [id number]  Chief Complaint  Patient presents with  . Back Pain   Pt presents to the office today with lower back pain that started this weekend after he was riding tractor for 2 1/2 hours.  Back Pain This is a new problem. The current episode started in the past 7 days. The problem occurs constantly. The problem is unchanged. The pain is present in the lumbar spine. The quality of the pain is described as aching. The pain radiates to the left thigh. The pain is at a severity of 10/10. The pain is moderate. The symptoms are aggravated by bending and twisting. Associated symptoms include leg pain. Pertinent negatives include no bladder incontinence or bowel incontinence. He has tried bed rest, NSAIDs and ice for the symptoms. The treatment provided mild relief.      Review of Systems  Gastrointestinal: Negative for bowel incontinence.  Genitourinary: Negative for bladder incontinence.  Musculoskeletal: Positive for back pain.  All other systems reviewed and are negative.      Objective:   Physical Exam Vitals signs reviewed.  Constitutional:      General: He is not in acute distress.    Appearance: He is well-developed.  HENT:     Head: Normocephalic.  Eyes:     General:        Right eye: No discharge.        Left eye: No discharge.     Pupils: Pupils are equal, round, and reactive to light.  Neck:     Musculoskeletal: Normal range of motion and neck supple.     Thyroid: No thyromegaly.  Cardiovascular:     Rate and Rhythm: Normal rate and regular rhythm.     Heart sounds: Normal heart sounds. No murmur.  Pulmonary:     Effort: Pulmonary effort is normal. No respiratory distress.     Breath sounds: Normal breath sounds. No wheezing.  Abdominal:     General: Bowel sounds are normal. There is no distension.     Palpations: Abdomen is soft.     Tenderness: There is no abdominal tenderness.   Musculoskeletal:        General: Tenderness present.     Comments: Pain in lumbar with flexion  Skin:    General: Skin is warm and dry.     Findings: No erythema or rash.  Neurological:     Mental Status: He is alert and oriented to person, place, and time.     Cranial Nerves: No cranial nerve deficit.     Deep Tendon Reflexes: Reflexes are normal and symmetric.  Psychiatric:        Behavior: Behavior normal.        Thought Content: Thought content normal.        Judgment: Judgment normal.       BP (!) 149/95   Pulse (!) 51   Temp 97.9 F (36.6 C) (Oral)   Ht 6\' 1"  (1.854 m)   Wt 185 lb 12.8 oz (84.3 kg)   BMI 24.51 kg/m      Assessment & Plan:  Fernando Gardner comes in today with chief complaint of Back Pain   Diagnosis and orders addressed:  1. Acute left-sided low back pain with left-sided sciatica Rest Ice ROM exercises encouraged Sedation precautions discussed No other NSAID's while taking Diclofenac RTO if symptoms worsen or do not improve  -  methylPREDNISolone acetate (DEPO-MEDROL) injection 80 mg - ketorolac (TORADOL) injection 60 mg - baclofen (LIORESAL) 10 MG tablet; Take 1 tablet (10 mg total) by mouth 3 (three) times daily.  Dispense: 30 each; Refill: 0 - diclofenac (VOLTAREN) 75 MG EC tablet; Take 1 tablet (75 mg total) by mouth 2 (two) times daily.  Dispense: 30 tablet; Refill: 0  Evelina Dun, FNP

## 2019-04-16 ENCOUNTER — Telehealth: Payer: Self-pay | Admitting: Family Medicine

## 2019-04-16 NOTE — Telephone Encounter (Signed)
No answer, no voicemail.

## 2019-04-16 NOTE — Telephone Encounter (Signed)
Patient states he no longer needs to speak with [place].

## 2019-04-17 ENCOUNTER — Other Ambulatory Visit: Payer: Self-pay

## 2019-04-17 DIAGNOSIS — Z20822 Contact with and (suspected) exposure to covid-19: Secondary | ICD-10-CM

## 2019-04-18 LAB — NOVEL CORONAVIRUS, NAA: SARS-CoV-2, NAA: NOT DETECTED

## 2019-08-31 DIAGNOSIS — R519 Headache, unspecified: Secondary | ICD-10-CM | POA: Diagnosis not present

## 2019-08-31 DIAGNOSIS — R6883 Chills (without fever): Secondary | ICD-10-CM | POA: Diagnosis not present

## 2019-08-31 DIAGNOSIS — R05 Cough: Secondary | ICD-10-CM | POA: Diagnosis not present

## 2019-08-31 DIAGNOSIS — Z6825 Body mass index (BMI) 25.0-25.9, adult: Secondary | ICD-10-CM | POA: Diagnosis not present

## 2020-04-14 DIAGNOSIS — Z20822 Contact with and (suspected) exposure to covid-19: Secondary | ICD-10-CM | POA: Diagnosis not present

## 2020-05-06 DIAGNOSIS — F17210 Nicotine dependence, cigarettes, uncomplicated: Secondary | ICD-10-CM | POA: Diagnosis not present

## 2020-05-06 DIAGNOSIS — M79662 Pain in left lower leg: Secondary | ICD-10-CM | POA: Diagnosis not present

## 2020-05-06 DIAGNOSIS — I809 Phlebitis and thrombophlebitis of unspecified site: Secondary | ICD-10-CM | POA: Diagnosis not present

## 2020-05-10 DIAGNOSIS — Z720 Tobacco use: Secondary | ICD-10-CM | POA: Diagnosis not present

## 2020-05-10 DIAGNOSIS — E785 Hyperlipidemia, unspecified: Secondary | ICD-10-CM | POA: Diagnosis not present

## 2020-05-10 DIAGNOSIS — I824Z3 Acute embolism and thrombosis of unspecified deep veins of distal lower extremity, bilateral: Secondary | ICD-10-CM | POA: Diagnosis not present

## 2020-05-10 DIAGNOSIS — Z7982 Long term (current) use of aspirin: Secondary | ICD-10-CM | POA: Diagnosis not present

## 2020-05-10 DIAGNOSIS — Z960 Presence of urogenital implants: Secondary | ICD-10-CM | POA: Diagnosis not present

## 2020-05-10 DIAGNOSIS — Z8673 Personal history of transient ischemic attack (TIA), and cerebral infarction without residual deficits: Secondary | ICD-10-CM | POA: Insufficient documentation

## 2020-05-10 DIAGNOSIS — I82451 Acute embolism and thrombosis of right peroneal vein: Secondary | ICD-10-CM | POA: Diagnosis not present

## 2020-05-10 DIAGNOSIS — L03116 Cellulitis of left lower limb: Secondary | ICD-10-CM | POA: Diagnosis not present

## 2020-05-10 DIAGNOSIS — I82409 Acute embolism and thrombosis of unspecified deep veins of unspecified lower extremity: Secondary | ICD-10-CM | POA: Diagnosis not present

## 2020-05-10 DIAGNOSIS — R918 Other nonspecific abnormal finding of lung field: Secondary | ICD-10-CM | POA: Diagnosis not present

## 2020-05-10 DIAGNOSIS — F1010 Alcohol abuse, uncomplicated: Secondary | ICD-10-CM | POA: Diagnosis not present

## 2020-05-10 DIAGNOSIS — K219 Gastro-esophageal reflux disease without esophagitis: Secondary | ICD-10-CM | POA: Diagnosis not present

## 2020-05-10 DIAGNOSIS — I82463 Acute embolism and thrombosis of calf muscular vein, bilateral: Secondary | ICD-10-CM | POA: Diagnosis not present

## 2020-05-10 DIAGNOSIS — F17210 Nicotine dependence, cigarettes, uncomplicated: Secondary | ICD-10-CM | POA: Diagnosis not present

## 2020-05-10 DIAGNOSIS — I82412 Acute embolism and thrombosis of left femoral vein: Secondary | ICD-10-CM | POA: Diagnosis not present

## 2020-05-10 DIAGNOSIS — N200 Calculus of kidney: Secondary | ICD-10-CM | POA: Diagnosis not present

## 2020-05-10 DIAGNOSIS — Z66 Do not resuscitate: Secondary | ICD-10-CM | POA: Diagnosis not present

## 2020-05-10 DIAGNOSIS — I82812 Embolism and thrombosis of superficial veins of left lower extremities: Secondary | ICD-10-CM | POA: Diagnosis not present

## 2020-05-10 DIAGNOSIS — I82492 Acute embolism and thrombosis of other specified deep vein of left lower extremity: Secondary | ICD-10-CM | POA: Diagnosis not present

## 2020-05-10 DIAGNOSIS — I82433 Acute embolism and thrombosis of popliteal vein, bilateral: Secondary | ICD-10-CM | POA: Diagnosis not present

## 2020-05-10 DIAGNOSIS — I82890 Acute embolism and thrombosis of other specified veins: Secondary | ICD-10-CM | POA: Diagnosis not present

## 2020-05-10 DIAGNOSIS — Z9889 Other specified postprocedural states: Secondary | ICD-10-CM | POA: Diagnosis not present

## 2020-05-10 DIAGNOSIS — N400 Enlarged prostate without lower urinary tract symptoms: Secondary | ICD-10-CM | POA: Diagnosis not present

## 2020-05-15 ENCOUNTER — Other Ambulatory Visit: Payer: Self-pay

## 2020-05-15 ENCOUNTER — Ambulatory Visit (INDEPENDENT_AMBULATORY_CARE_PROVIDER_SITE_OTHER): Payer: BC Managed Care – PPO | Admitting: Nurse Practitioner

## 2020-05-15 ENCOUNTER — Ambulatory Visit (HOSPITAL_COMMUNITY)
Admission: RE | Admit: 2020-05-15 | Discharge: 2020-05-15 | Disposition: A | Discharge status: Home / Self Care | Payer: BC Managed Care – PPO | Source: Ambulatory Visit | Attending: Nurse Practitioner | Admitting: Nurse Practitioner

## 2020-05-15 ENCOUNTER — Encounter: Payer: Self-pay | Admitting: Nurse Practitioner

## 2020-05-15 VITALS — BP 136/89 | HR 67 | Temp 98.1°F | Resp 20 | Ht 73.0 in | Wt 186.0 lb

## 2020-05-15 DIAGNOSIS — I824Y3 Acute embolism and thrombosis of unspecified deep veins of proximal lower extremity, bilateral: Secondary | ICD-10-CM

## 2020-05-15 DIAGNOSIS — I82412 Acute embolism and thrombosis of left femoral vein: Secondary | ICD-10-CM | POA: Diagnosis not present

## 2020-05-15 DIAGNOSIS — I82432 Acute embolism and thrombosis of left popliteal vein: Secondary | ICD-10-CM | POA: Diagnosis not present

## 2020-05-15 DIAGNOSIS — I82890 Acute embolism and thrombosis of other specified veins: Secondary | ICD-10-CM | POA: Diagnosis not present

## 2020-05-15 DIAGNOSIS — I82812 Embolism and thrombosis of superficial veins of left lower extremities: Secondary | ICD-10-CM | POA: Diagnosis not present

## 2020-05-15 NOTE — Patient Instructions (Signed)

## 2020-05-15 NOTE — Addendum Note (Signed)
Addended by: Bennie Pierini on: 05/15/2020 04:42 PM   Modules accepted: Orders

## 2020-05-15 NOTE — Progress Notes (Signed)
   Subjective:    Patient ID: Fernando Gardner, male    DOB: November 09, 1960, 60 y.o.   MRN: [id number]   Chief Complaint: Left leg swelling   HPI Patient was admitted to the hospital on 05/06/20 with DVT bil lower ext. This is no discharge summary in computer to review. They gave him heparin in the hospital and discharged him on eliquis. He says yesterday his left leg started swelling in upper groin area. Has red spots in legs which are hard to touch. He denies any SOB.   Review of Systems  Constitutional: Negative for diaphoresis.  Eyes: Negative for pain.  Respiratory: Negative for shortness of breath.   Cardiovascular: Negative for chest pain, palpitations and leg swelling.  Gastrointestinal: Negative for abdominal pain.  Endocrine: Negative for polydipsia.  Skin: Negative for rash.  Neurological: Negative for dizziness, weakness and headaches.  Hematological: Does not bruise/bleed easily.  All other systems reviewed and are negative.      Objective:   Physical Exam Vitals and nursing note reviewed.  Constitutional:      Appearance: Normal appearance. He is well-developed and well-nourished.  HENT:     Mouth/Throat:     Mouth: Oropharynx is clear and moist.  Eyes:     Extraocular Movements: EOM normal.  Neck:     Thyroid: No thyroid mass or thyromegaly.     Vascular: No carotid bruit or JVD.     Trachea: Phonation normal.  Cardiovascular:     Rate and Rhythm: Normal rate and regular rhythm.  Pulmonary:     Effort: Pulmonary effort is normal. No respiratory distress.     Breath sounds: Normal breath sounds.  Abdominal:     General: Aorta is normal.     Tenderness: There is no abdominal tenderness.  Musculoskeletal:        General: Normal range of motion.     Comments: Left groin edema  Lymphadenopathy:     Cervical: No cervical adenopathy.  Skin:    General: Skin is warm and dry.  Neurological:     Mental Status: He is alert and oriented to person, place, and time.   Psychiatric:        Mood and Affect: Mood and affect normal.        Behavior: Behavior normal.        Thought Content: Thought content normal.        Judgment: Judgment normal.    BP 136/89   Pulse 67   Temp 98.1 F (36.7 C) (Temporal)   Resp 20   Ht 6\' 1"  (1.854 m)   Wt 186 lb (84.4 kg)   SpO2 98%   BMI 24.54 kg/m         Assessment & Plan:  in today with chief complaint of Left leg swelling   1. Acute deep vein thrombosis (DVT) of proximal vein of both lower extremities (HCC) Will talk  Once have results back Hospital records reviewed - Fernando Gardner Venous Img Lower Unilateral Left; Future    The above assessment and management plan was discussed with the patient. The patient verbalized understanding of and has agreed to the management plan. Patient is aware to call the clinic if symptoms persist or worsen. Patient is aware when to return to the clinic for a follow-up visit. Patient educated on when it is appropriate to go to the emergency department.   Mary-Margaret [place], FNP

## 2020-05-21 ENCOUNTER — Other Ambulatory Visit (HOSPITAL_COMMUNITY): Payer: Self-pay | Admitting: Vascular Surgery

## 2020-05-21 ENCOUNTER — Ambulatory Visit (INDEPENDENT_AMBULATORY_CARE_PROVIDER_SITE_OTHER): Payer: BC Managed Care – PPO | Admitting: Physician Assistant

## 2020-05-21 ENCOUNTER — Other Ambulatory Visit: Payer: Self-pay

## 2020-05-21 ENCOUNTER — Ambulatory Visit (HOSPITAL_COMMUNITY)
Admission: RE | Admit: 2020-05-21 | Discharge: 2020-05-21 | Disposition: A | Discharge status: Home / Self Care | Payer: BC Managed Care – PPO | Source: Ambulatory Visit | Attending: Vascular Surgery | Admitting: Vascular Surgery

## 2020-05-21 VITALS — BP 125/87 | HR 78 | Temp 98.5°F | Resp 18 | Ht 73.0 in | Wt 180.0 lb

## 2020-05-21 DIAGNOSIS — R600 Localized edema: Secondary | ICD-10-CM

## 2020-05-21 DIAGNOSIS — I82812 Embolism and thrombosis of superficial veins of left lower extremities: Secondary | ICD-10-CM

## 2020-05-21 DIAGNOSIS — I82403 Acute embolism and thrombosis of unspecified deep veins of lower extremity, bilateral: Secondary | ICD-10-CM

## 2020-05-21 NOTE — Progress Notes (Signed)
Requested by:  Chevis Pretty, [place],  [place] [address]  Reason for consultation: bilateral lower extremity DVT   History of Present Illness   Fernando Gardner is a 60 y.o. (1961/03/01) male who presents for evaluation of bilateral lower extremity DVT. Patient was admitted to [facility] on 05/10/20-05/11/20 after presenting to the ED with bilateral lower extremity pain. He was initially diagnosed [facility] with thrombophlebitis several days prior and continued to have pain and difficulty ambulating so he returned to ER at [facility]. A duplex at [facility] showed RLE with occlusive DVT of the popliteal, peroneal and gastroc veins, LLE sub occlusive DVT of the CFV, occlusive and nonocclusive thrombus of the GSV, popliteal and gastroc veins. He explains that he was having flu like symptoms including fever, chills, muscle aches prior to all this starting. He was never tested for COVID or the Flu at the time. But due to being sick he had several days of sedentary activity otherwise he does not report any injury or inciting factors. At [facility] he was treated overnight with Heparin drip and was discharge on Eliquis. He was instructed to elevate and purchase compression stockings  He was seen in follow up by his PCP on 05/16/19 with new symptoms of swelling and pain in left thigh and leg. His PCP ordered a repeat duplex due to new symptoms. Duplex showed partially compressible CF vein with wall thickening consistent with residual thrombus extending into the SFJ. Normal compressibility of femoral vein and thrombus in the popliteal vein as well as the SSV. The RLE was not imaged beyond the CF vein for comparison. No DVT was identified.   Today he reports that on Monday he developed a swollen painful area on his right foot. This is very painful to walk on. Both legs are still painful and tender especially when ambulating. He says the left has burning, tingling and tightness. The right is more sore  in the foot where it is red and swollen. He has been compliant with his Eliquis. He also states he has been elevating. He has not yet purchased compression stockings.   He reports that he works in [employer] (medical) and so normally by end of day he has swelling in his legs but this resolves easily with elevation. He has no history of DVT. Possible family history in brother of DVT. He does not describe any claudication symptoms or rest pain prior to this event. He denies any fever or chills, chest pain or shortness of breath   Past Medical History:  Diagnosis Date  . Allergy   . CHF (congestive heart failure) ([place])   . Chronic kidney disease   . CVA (cerebral vascular accident) ([place])   . Diabetes mellitus without complication ([place])   . Hypertension   . Myocardial infarction ([place])   . Seizures ([place])   . Stroke [facility])    patient stated he has had many mini-strokes  . Thyroid disease     Past Surgical History:  Procedure Laterality Date  . BACK SURGERY     Lower back 2003/2004    Social History   Socioeconomic History  . Marital status: Married    Spouse name: Not on file  . Number of children: Not on file  . Years of education: Not on file  . Highest education level: Not on file  Occupational History  . Not on file  Tobacco Use  . Smoking status: Heavy Tobacco Smoker    Packs/day:  1.50    Types: Cigarettes  . Smokeless tobacco: Current User  Vaping Use  . Vaping Use: Never used  Substance and Sexual Activity  . Alcohol use: Yes    Alcohol/week: 2.0 standard drinks    Types: 2 Cans of beer per week  . Drug use: No  . Sexual activity: Not on file  Other Topics Concern  . Not on file  Social History Narrative  . Not on file   Social Determinants of Health   Financial Resource Strain: Not on file  Food Insecurity: Not on file  Transportation Needs: Not on file  Physical Activity: Not on file  Stress: Not on file  Social Connections: Not on file   Intimate Partner Violence: Not on file    Family History  Problem Relation Age of Onset  . Heart disease Father   . Hypertension Father   . Diabetes Brother   . Kidney disease Maternal Aunt   . Kidney disease Maternal Uncle     Current Outpatient Medications  Medication Sig Dispense Refill  . apixaban (ELIQUIS) 5 MG TABS tablet 10 mg (2 tablets) oral 2 times a day x 7 days followed by 5 mg (1 tablet) 2 times a day for completion of therapy duration.    Marland Kitchen linezolid (ZYVOX) 600 MG tablet Take 600 mg by mouth 2 (two) times daily.     No current facility-administered medications for this visit.    No Known Allergies  REVIEW OF SYSTEMS (negative unless checked):   Cardiac:  []  Chest pain or chest pressure? []  Shortness of breath upon activity? []  Shortness of breath when lying flat? []  Irregular heart rhythm?  Vascular:  []  Pain in calf, thigh, or hip brought on by walking? []  Pain in feet at night that wakes you up from your sleep? [x]  Blood clot in your veins? [x]  Leg swelling?  Pulmonary:  []  Oxygen at home? []  Productive cough? []  Wheezing?  Neurologic:  []  Sudden weakness in arms or legs? []  Sudden numbness in arms or legs? []  Sudden onset of difficult speaking or slurred speech? []  Temporary loss of vision in one eye? []  Problems with dizziness?  Gastrointestinal:  []  Blood in stool? []  Vomited blood?  Genitourinary:  []  Burning when urinating? []  Blood in urine?  Psychiatric:  []  Major depression  Hematologic:  []  Bleeding problems? []  Problems with blood clotting?  Dermatologic:  []  Rashes or ulcers?  Constitutional:  []  Fever or chills?  Ear/Nose/Throat:  []  Change in hearing? []  Nose bleeds? []  Sore throat?  Musculoskeletal:  []  Back pain? []  Joint pain? []  Muscle pain?   Physical Examination     Vitals:   05/21/20 1248  BP: 125/87  Pulse: 78  Resp: 18  Temp: 98.5 F (36.9 C)  TempSrc: Temporal  SpO2: 99%  Weight: 180  lb (81.6 kg)  Height: 6\' 1"  (1.854 m)   Body mass index is 23.75 kg/m.  General:  WDWN in NAD; vital signs documented above Gait: Normal HENT: WNL, normocephalic Pulmonary: normal non-labored breathing , without wheezing Cardiac: regular HR, without  Murmurs without carotid bruit Abdomen: soft, NT, no masses Vascular Exam/Pulses:  Right Left  Radial 2+ (normal) 2+ (normal)  Femoral 2+ (normal) 2+ (normal)  Popliteal 2+ (normal) 2+ (normal)  DP 2+ (normal) 2+ (normal)  PT 2+ (normal) 2+ (normal)   Extremities: with varicose veins, without reticular veins, without edema, without stasis pigmentation, without lipodermatosclerosis, without ulcers  Right medial ankle erythematous, warm to palpation  and very tender  Left thigh and leg with thrombosed superficial veins with hyperpigmentation of overlying skin, tender to palpation. No erythema Musculoskeletal: no muscle wasting or atrophy  Neurologic: A&O X 3;  No focal weakness or paresthesias are detected Psychiatric:  The pt has Normal affect.  Non-invasive Vascular Imaging   BLE Venous Insufficiency Duplex   US Venous Lower Extremity Bilateral Result Date: 05/10/2020 TECHNIQUE: The veins of both lower extremities were interrogated from the visible common femoral vein to the distal popliteal vein. The junction of the greater saphenous with the common femoral vein as well as the posterior tibial veins were evaluated. Gray scale, color, spectral, and doppler sonography was utilized and analyzed. COMPARISON: Bilateral lower extremity DVT study 12/10/2016 INDICATION: LE swelling/pain, r/o DVT, FINDINGS: Right lower extremity: Positive for occlusive DVT within the popliteal, peroneal, and gastrocnemius veins. Left lower extremity: Positive for subocclusive thrombus within the left common femoral vein, occlusive and nonocclusive thrombus in the greater saphenous vein, and occlusive thrombus within the popliteal and gastrocnemius veins.    IMPRESSION: Bilateral lower extremity DVT as described above. ###CODE CRITICAL REPORT### (automated ordering provider notification pathway initiated at 05/10/2020 1:01 PM) Electronically Signed by: Verdie Drown  LLE venous duplex 05/16/19: IMPRESSION: 1. Positive for acute occlusive DVT in the popliteal vein. 2. There is associated superficial venous thrombosis in the small saphenous vein. 3. Positive for nonocclusive DVT in the common femoral vein extending into the saphenofemoral junction.  BLE venous duplex 05/21/20: RIGHT:  - There is no evidence of deep vein thrombosis in the lower extremity.  - The small saphenous vein is thrombosed with no color or spectral doppler flow observed.    LEFT:  - Findings consistent with continued acute deep vein thrombosis involving the left popliteal vein and gastroc veins.  - The great saphenous vein is thrombosed.    Medical Decision Making   MICHALE FATULA is a 61 y.o. male who presents with Bilateral acute DVT. LLE more extensive than the RLE on initial presentation to ER. Presently on medical management with Eliquis. Duplex of BLE today just shows Right SSV thrombosis and residual LLE popliteal, gastroc and GSV thrombosis. He is mostly symptomatic from his superficial thrombophlebitis bilaterally. There has been interval resolution of some of the bilateral DVT  Recommend Compression stockings to Bilateral lower extremities 20-72mm Hg thigh high. He has been measured in the office today for a pair  Elevate legs when sitting or in bed. Explained proper form for this in depth  Continue Eliquis 5 mg BID  Saw patient with Dr. Scot Dock who agrees with continued conservative therapy as DVTs are resolving  Do not feel that he needs antibiotics at this time but advised him to call should redness worsen or if he develops fever or chills  Will have him follow up in 2-3 weeks in [place] clinic   Karoline Caldwell, PA-C [facility]  [place]: [phone]  05/21/2020, 12:52 PM  Clinic MD: Dr. Scot Dock

## 2020-05-22 ENCOUNTER — Other Ambulatory Visit: Payer: Self-pay

## 2020-05-22 ENCOUNTER — Other Ambulatory Visit: Payer: Self-pay | Admitting: Physician Assistant

## 2020-05-22 ENCOUNTER — Telehealth: Payer: Self-pay

## 2020-05-22 MED ORDER — TRAMADOL HCL 50 MG PO TABS: 50.0000 mg | ORAL_TABLET | Freq: Four times a day (QID) | ORAL | 0 refills | Status: DC | PRN

## 2020-05-22 NOTE — Progress Notes (Signed)
Patients wife called requesting pain medication for the patient. He has been instructed not to take NSAIDs since taking Eliquis and has been taking extra strength tylenol without any relief. Patients wife says patient is in a lot of discomfort and unable to walk due to pain. I reviewed PDMP and I have sent prescription for Tramadol 50 mg #20 no refills. Prescription has been sent to patients preferred pharmacy   Karoline Caldwell, PA-C [facility] [phone]

## 2020-05-22 NOTE — Telephone Encounter (Signed)
Patient's wife called, patient is currently unable to walk and crawling from pain. Seen in clinic yesterday. Discussed with PA, called in RX.

## 2020-06-02 ENCOUNTER — Encounter: Payer: Self-pay | Admitting: Vascular Surgery

## 2020-06-02 ENCOUNTER — Other Ambulatory Visit: Payer: Self-pay

## 2020-06-02 ENCOUNTER — Ambulatory Visit (INDEPENDENT_AMBULATORY_CARE_PROVIDER_SITE_OTHER): Payer: BC Managed Care – PPO | Admitting: Physician Assistant

## 2020-06-02 VITALS — BP 127/83 | HR 72 | Temp 98.2°F | Wt 187.3 lb

## 2020-06-02 DIAGNOSIS — I82403 Acute embolism and thrombosis of unspecified deep veins of lower extremity, bilateral: Secondary | ICD-10-CM

## 2020-06-02 DIAGNOSIS — I82812 Embolism and thrombosis of superficial veins of left lower extremities: Secondary | ICD-10-CM | POA: Diagnosis not present

## 2020-06-02 NOTE — Progress Notes (Signed)
VASCULAR & VEIN SPECIALISTS           [facility] [place]  History and Physical   Fernando Gardner is a 60 y.o. male who presents for follow up for acute BLE DVT and superficial thrombophlebitis.  He was seen on 05/21/2020 and at that time, Patient had been admitted to [facility] on 05/10/20-05/11/20 after presenting to the ED with bilateral lower extremity pain. He was initially diagnosed [facility] with thrombophlebitis several days prior and continued to have pain and difficulty ambulating so he returned to ER at [facility]. A duplex at [facility] showed RLE with occlusive DVT of the popliteal, peroneal and gastroc veins, LLE sub occlusive DVT of the CFV, occlusive and nonocclusive thrombus of the GSV, popliteal and gastroc veins. He explains that he was having flu like symptoms including fever, chills, muscle aches prior to all this starting. He was never tested for COVID or the Flu at the time. But due to being sick he had several days of sedentary activity otherwise he does not report any injury or inciting factors. At [facility] he was treated overnight with Heparin drip and was discharge on Eliquis. He was instructed to elevate and purchase compression stockings  He was seen in follow up by his PCP on 05/16/19 with new symptoms of swelling and pain in left thigh and leg. His PCP ordered a repeat duplex due to new symptoms. Duplex showed partially compressible CF vein with wall thickening consistent with residual thrombus extending into the SFJ. Normal compressibility of femoral vein and thrombus in the popliteal vein as well as the SSV. The RLE was not imaged beyond the CF vein for comparison. No DVT was identified.   At his last visit, he reported that on Monday he developed a swollen painful area on his right foot. This is very painful to walk on. Both legs are still painful and tender especially when ambulating. He says the left has burning, tingling and tightness. The right is more sore in the foot where it  is red and swollen. He has been compliant with his Eliquis. He also states he has been elevating. He has not yet purchased compression stockings.  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  He was fitted for compression socks at his last visit.     He presents today for follow up.  He states that his legs are much better. He still has some pain around the right ankle but this is better.  The swelling is improved as well.  He states he has been wearing his compression socks about every other day.  He is elevating his legs.   He states he has a brother that had a DVT in the past.  Pt has not had any genetic testing.  The pt is not on a statin for cholesterol management.  The pt is not on a daily aspirin.   Other AC:  Eliquis The pt is not on medication for hypertension.   The pt is not diabetic.   Tobacco hx:  Current 1.5ppd   Past Medical History:  Diagnosis Date  . Allergy   . CHF (congestive heart failure) ([place])   . Chronic kidney disease   . CVA (cerebral vascular accident) ([place])   . Diabetes mellitus without complication ([place])   . Hypertension   . Myocardial infarction ([place])   . Seizures ([place])   .  Stroke [facility])    patient stated he has had many mini-strokes  . Thyroid disease     Past Surgical History:  Procedure Laterality Date  . BACK SURGERY     Lower back 2003/2004    Social History   Socioeconomic History  . Marital status: Married    Spouse name: Not on file  . Number of children: Not on file  . Years of education: Not on file  . Highest education level: Not on file  Occupational History  . Not on file  Tobacco Use  . Smoking status: Heavy Tobacco Smoker    Packs/day: 1.50    Types: Cigarettes  . Smokeless tobacco: Current User  Vaping Use  . Vaping Use: Never used  Substance and Sexual Activity  . Alcohol use: Yes    Alcohol/week: 2.0 standard drinks    Types: 2 Cans of  beer per week  . Drug use: No  . Sexual activity: Not on file  Other Topics Concern  . Not on file  Social History Narrative  . Not on file   Social Determinants of Health   Financial Resource Strain: Not on file  Food Insecurity: Not on file  Transportation Needs: Not on file  Physical Activity: Not on file  Stress: Not on file  Social Connections: Not on file  Intimate Partner Violence: Not on file     Family History  Problem Relation Age of Onset  . Heart disease Father   . Hypertension Father   . Diabetes Brother   . Kidney disease Maternal Aunt   . Kidney disease Maternal Uncle     Current Outpatient Medications  Medication Sig Dispense Refill  . apixaban (ELIQUIS) 5 MG TABS tablet 10 mg (2 tablets) oral 2 times a day x 7 days followed by 5 mg (1 tablet) 2 times a day for completion of therapy duration.    Marland Kitchen linezolid (ZYVOX) 600 MG tablet Take 600 mg by mouth 2 (two) times daily. (Patient not taking: Reported on 05/21/2020)    . traMADol (ULTRAM) 50 MG tablet Take 1 tablet (50 mg total) by mouth every 6 (six) hours as needed for severe pain. 20 tablet 0   No current facility-administered medications for this visit.    No Known Allergies  REVIEW OF SYSTEMS:   [X]  denotes positive finding, [ ]  denotes negative finding Cardiac  Comments:  Chest pain or chest pressure:    Shortness of breath upon exertion:    Short of breath when lying flat:    Irregular heart rhythm:        Vascular    Pain in calf, thigh, or hip brought on by ambulation:    Pain in feet at night that wakes you up from your sleep:     Blood clot in your veins: x   Leg swelling:  x       Pulmonary    Oxygen at home:    Productive cough:     Wheezing:         Neurologic    Sudden weakness in arms or legs:     Sudden numbness in arms or legs:     Sudden onset of difficulty speaking or slurred speech:    Temporary loss of vision in one eye:     Problems with dizziness:          Gastrointestinal    Blood in stool:     Vomited blood:  Genitourinary    Burning when urinating:     Blood in urine:        Psychiatric    Major depression:         Hematologic    Bleeding problems:    Problems with blood clotting too easily:        Skin    Rashes or ulcers:        Constitutional    Fever or chills:      PHYSICAL EXAMINATION:  Today's Vitals   06/02/20 1426  BP: 127/83  Pulse: 72  Temp: 98.2 F (36.8 C)  TempSrc: Skin  SpO2: 98%  Weight: 187 lb 4.8 oz (85 kg)  PainSc: 3    Body mass index is 24.71 kg/m.   General:  WDWN in NAD; vital signs documented above Gait: Not observed HENT: WNL, normocephalic Pulmonary: normal non-labored breathing without wheezing Cardiac: regular HR Skin: without rashes Vascular Exam/Pulses:  Right Left  DP 1+ (weak) 2+ (normal)  PT Unable to palpate Unable to palpate   Extremities: without ischemic changes, without cellulitis; without open wounds; without skin color changes; very mild pitting edema bilaterally at sock line.   Musculoskeletal: no muscle wasting or atrophy  Neurologic: A&O X 3;  moving all extremities equally Psychiatric:  The pt has Normal affect.   Non-Invasive Vascular Imaging:   Venous duplex on 1.12.2022: RIGHT:  - There is no evidence of deep vein thrombosis in the lower extremity.  - The small saphenous vein is thrombosed with no color or spectral doppler  flow observed.    LEFT:  - Findings consistent with continued acute deep vein thrombosis involving  the left popliteal vein and gastroc veins.  - The great saphenous vein is thrombosed.   Fernando Gardner is a 60 y.o. male who is here for follow up for BLE acute DVT with left leg worse than right and superficial thrombophlebitis bilaterally   -pt states that his pain and swelling have improved.   -recommend continuing wearing compression socks every day as well as elevating his legs.  -recommend anticoagulation for 3-6  months.  Once he is off his anticoagulation, recommend referral to hematologist for testing.  Will defer this to his PCP.  -had discussion with pt about importance of smoking cessation.  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  Discussed with him to wear his compression socks to work and elevate legs when he gets home.  -he will follow up with [place] as needed.    Doreatha Massed, [facility] Ve[facility] 06/02/2020 2:22 PM  Clinic MD:  Myra Gianotti

## 2020-06-09 ENCOUNTER — Other Ambulatory Visit: Payer: Self-pay

## 2020-06-09 ENCOUNTER — Other Ambulatory Visit: Payer: Self-pay | Admitting: Family

## 2020-06-09 DIAGNOSIS — M5442 Lumbago with sciatica, left side: Secondary | ICD-10-CM

## 2020-06-09 NOTE — Telephone Encounter (Signed)
  Prescription Request  06/09/2020  What is the name of the medication or equipment? Apixaban 5 mg Patient is out and has appt 2-24 and needs enough called in to get him to his appt  Have you contacted your pharmacy to request a refill? (if applicable) YES  Which pharmacy would you like this sent to? [facility] [place]  Patient notified that their request is being sent to the clinical staff for review and that they should receive a response within 2 business days.

## 2020-06-10 MED ORDER — APIXABAN 5 MG PO TABS: ORAL_TABLET | ORAL | 0 refills | Status: DC

## 2020-06-11 ENCOUNTER — Telehealth: Payer: Self-pay

## 2020-06-11 NOTE — Telephone Encounter (Signed)
Left message to call back  

## 2020-07-03 ENCOUNTER — Other Ambulatory Visit: Payer: Self-pay | Admitting: Family Medicine

## 2020-07-03 ENCOUNTER — Ambulatory Visit (INDEPENDENT_AMBULATORY_CARE_PROVIDER_SITE_OTHER): Payer: BC Managed Care – PPO | Admitting: Family Medicine

## 2020-07-03 ENCOUNTER — Other Ambulatory Visit: Payer: Self-pay

## 2020-07-03 ENCOUNTER — Encounter: Payer: Self-pay | Admitting: Family Medicine

## 2020-07-03 VITALS — BP 129/84 | HR 77 | Ht 73.0 in | Wt 191.0 lb

## 2020-07-03 DIAGNOSIS — Z125 Encounter for screening for malignant neoplasm of prostate: Secondary | ICD-10-CM | POA: Diagnosis not present

## 2020-07-03 DIAGNOSIS — Z86718 Personal history of other venous thrombosis and embolism: Secondary | ICD-10-CM | POA: Insufficient documentation

## 2020-07-03 DIAGNOSIS — Z131 Encounter for screening for diabetes mellitus: Secondary | ICD-10-CM | POA: Diagnosis not present

## 2020-07-03 DIAGNOSIS — Z13220 Encounter for screening for lipoid disorders: Secondary | ICD-10-CM

## 2020-07-03 DIAGNOSIS — Z1211 Encounter for screening for malignant neoplasm of colon: Secondary | ICD-10-CM

## 2020-07-03 DIAGNOSIS — I824Y3 Acute embolism and thrombosis of unspecified deep veins of proximal lower extremity, bilateral: Secondary | ICD-10-CM

## 2020-07-03 MED ORDER — APIXABAN 5 MG PO TABS: 5.0000 mg | ORAL_TABLET | Freq: Two times a day (BID) | ORAL | 5 refills | Status: DC

## 2020-07-03 NOTE — Progress Notes (Signed)
BP 129/84   Pulse 77   Ht _0  (1.854 m)   Wt 191 lb (86.6 kg)   SpO2 97%   BMI 25.20 kg/m    Subjective:   Patient ID: Fernando Gardner, male    DOB: May 29, 1960, 60 y.o.   MRN: [id number]  HPI: Fernando Gardner is a 59 y.o. male presenting on 07/03/2020 for Medical Management of Chronic Issues and anticoagulated (Refill needed of Eliquis. H/O blood clot LE)   HPI dvt  Patient was diagnosed with bilateral lower extremity DVTs about a month ago and has started on Eliquis, he denies any chest pain or shortness of breath.  He denies any bleeding or bruising he has been doing good on the Eliquis.  He is down to taking it just the twice a day maintenance dose.  Relevant past medical, surgical, family and social history reviewed and updated as indicated. Interim medical history since our last visit reviewed. Allergies and medications reviewed and updated.  Review of Systems  Constitutional: Negative for chills and fever.  Respiratory: Negative for shortness of breath and wheezing.   Cardiovascular: Negative for chest pain and leg swelling.  Musculoskeletal: Negative for back pain and gait problem.  Skin: Negative for color change, rash and wound.  All other systems reviewed and are negative.   Per HPI unless specifically indicated above   Allergies as of 07/03/2020   No Known Allergies     Medication List       Accurate as of July 03, 2020  1:53 PM. If you have any questions, ask your nurse or doctor.        STOP taking these medications   linezolid 600 MG tablet Commonly known as: ZYVOX Stopped by: Fransisca Kaufmann Dettinger, MD   traMADol 50 MG tablet Commonly known as: Ultram Stopped by: Worthy Rancher, MD     TAKE these medications   apixaban 5 MG Tabs tablet Commonly known as: ELIQUIS 10 mg (2 tablets) oral 2 times a day x 7 days followed by 5 mg (1 tablet) 2 times a day for completion of therapy duration.        Objective:   BP 129/84   Pulse 77   Ht 6'  1" (1.854 m)   Wt 191 lb (86.6 kg)   SpO2 97%   BMI 25.20 kg/m   Wt Readings from Last 3 Encounters:  07/03/20 191 lb (86.6 kg)  06/02/20 187 lb 4.8 oz (85 kg)  05/21/20 180 lb (81.6 kg)    Physical Exam Vitals and nursing note reviewed.  Constitutional:      General: He is not in acute distress.    Appearance: He is well-developed and well-nourished. He is not diaphoretic.  Eyes:     General: No scleral icterus.    Extraocular Movements: EOM normal.     Conjunctiva/sclera: Conjunctivae normal.  Neck:     Thyroid: No thyromegaly.  Cardiovascular:     Rate and Rhythm: Normal rate and regular rhythm.     Pulses: Intact distal pulses.     Heart sounds: Normal heart sounds. No murmur heard.   Pulmonary:     Effort: Pulmonary effort is normal. No respiratory distress.     Breath sounds: Normal breath sounds. No wheezing.  Musculoskeletal:        General: No swelling (Palpable cords in his calf consistent with superficial thrombus, no inflammation or redness or warmth) or edema. Normal range of motion.     Cervical  back: Neck supple.  Lymphadenopathy:     Cervical: No cervical adenopathy.  Skin:    General: Skin is warm and dry.     Findings: No rash.  Neurological:     Mental Status: He is alert and oriented to person, place, and time.     Coordination: Coordination normal.  Psychiatric:        Mood and Affect: Mood and affect normal.        Behavior: Behavior normal.       Assessment & Plan:   Problem List Items Addressed This Visit      Other   History of DVT (deep vein thrombosis) - Primary    Other Visit Diagnoses    Diabetes mellitus screening       Relevant Orders   CMP14+EGFR   Lipid panel   Lipid screening       Relevant Orders   Lipid panel   Prostate cancer screening       Relevant Orders   PSA, total and free   Colon cancer screening       Relevant Orders   Cologuard      Patient recently been seen in January 1 month ago, will continue  with the medicine for at least 6 months. Follow up plan: Return in about 5 months (around 11/30/2020), or if symptoms worsen or fail to improve, for Follow-up DVT.  Counseling provided for all of the vaccine components No orders of the defined types were placed in this encounter.   Caryl Pina, MD [facility] 07/03/2020, 1:53 PM

## 2020-07-04 ENCOUNTER — Other Ambulatory Visit: Payer: Self-pay

## 2020-07-04 DIAGNOSIS — Z13220 Encounter for screening for lipoid disorders: Secondary | ICD-10-CM

## 2020-07-04 LAB — CBC WITH DIFFERENTIAL/PLATELET
Basophils Absolute: 0.1 10*3/uL (ref 0.0–0.2)
Basos: 1 %
EOS (ABSOLUTE): 0.1 10*3/uL (ref 0.0–0.4)
Eos: 3 %
Hematocrit: 44.1 % (ref 37.5–51.0)
Hemoglobin: 14.7 g/dL (ref 13.0–17.7)
Immature Grans (Abs): 0 10*3/uL (ref 0.0–0.1)
Immature Granulocytes: 0 %
Lymphocytes Absolute: 1.3 10*3/uL (ref 0.7–3.1)
Lymphs: 27 %
MCH: 30.4 pg (ref 26.6–33.0)
MCHC: 33.3 g/dL (ref 31.5–35.7)
MCV: 91 fL (ref 79–97)
Monocytes Absolute: 0.5 10*3/uL (ref 0.1–0.9)
Monocytes: 10 %
Neutrophils Absolute: 2.9 10*3/uL (ref 1.4–7.0)
Neutrophils: 59 %
Platelets: 213 10*3/uL (ref 150–450)
RBC: 4.84 x10E6/uL (ref 4.14–5.80)
RDW: 15 % (ref 11.6–15.4)
WBC: 5 10*3/uL (ref 3.4–10.8)

## 2020-07-04 LAB — CMP14+EGFR
ALT: 14 IU/L (ref 0–44)
AST: 15 IU/L (ref 0–40)
Albumin/Globulin Ratio: 1.8 (ref 1.2–2.2)
Albumin: 4.4 g/dL (ref 3.8–4.9)
Alkaline Phosphatase: 72 IU/L (ref 44–121)
BUN/Creatinine Ratio: 13 (ref 9–20)
BUN: 18 mg/dL (ref 6–24)
Bilirubin Total: 0.4 mg/dL (ref 0.0–1.2)
CO2: 21 mmol/L (ref 20–29)
Calcium: 9.5 mg/dL (ref 8.7–10.2)
Chloride: 103 mmol/L (ref 96–106)
Creatinine, Ser: 1.44 mg/dL — ABNORMAL HIGH (ref 0.76–1.27)
GFR calc Af Amer: 62 mL/min/{1.73_m2} (ref >59)
GFR calc non Af Amer: 54 mL/min/{1.73_m2} — ABNORMAL LOW (ref >59)
Globulin, Total: 2.4 g/dL (ref 1.5–4.5)
Glucose: 85 mg/dL (ref 65–99)
Potassium: 4.2 mmol/L (ref 3.5–5.2)
Sodium: 141 mmol/L (ref 134–144)
Total Protein: 6.8 g/dL (ref 6.0–8.5)

## 2020-07-04 LAB — LIPID PANEL
Chol/HDL Ratio: 4.8 ratio (ref 0.0–5.0)
Cholesterol, Total: 210 mg/dL — ABNORMAL HIGH (ref 100–199)
HDL: 44 mg/dL (ref >39)
LDL Chol Calc (NIH): 135 mg/dL — ABNORMAL HIGH (ref 0–99)
Triglycerides: 175 mg/dL — ABNORMAL HIGH (ref 0–149)
VLDL Cholesterol Cal: 31 mg/dL (ref 5–40)

## 2020-07-04 LAB — PSA, TOTAL AND FREE
PSA, Free Pct: 31.4 %
PSA, Free: 0.22 ng/mL
Prostate Specific Ag, Serum: 0.7 ng/mL (ref 0.0–4.0)

## 2020-07-04 MED ORDER — ATORVASTATIN CALCIUM 20 MG PO TABS: 20.0000 mg | ORAL_TABLET | Freq: Every evening | ORAL | 1 refills | Status: DC

## 2020-07-07 ENCOUNTER — Encounter: Payer: Self-pay | Admitting: Family Medicine

## 2020-07-07 ENCOUNTER — Other Ambulatory Visit: Payer: Self-pay

## 2020-07-07 ENCOUNTER — Ambulatory Visit (INDEPENDENT_AMBULATORY_CARE_PROVIDER_SITE_OTHER): Payer: BC Managed Care – PPO | Admitting: Family Medicine

## 2020-07-07 VITALS — BP 130/76 | HR 60 | Temp 98.1°F | Ht 73.0 in | Wt 192.1 lb

## 2020-07-07 DIAGNOSIS — Z13220 Encounter for screening for lipoid disorders: Secondary | ICD-10-CM | POA: Diagnosis not present

## 2020-07-07 DIAGNOSIS — R319 Hematuria, unspecified: Secondary | ICD-10-CM

## 2020-07-07 LAB — MICROSCOPIC EXAMINATION
Bacteria, UA: NONE SEEN
Epithelial Cells (non renal): NONE SEEN /hpf (ref 0–10)
WBC, UA: NONE SEEN /hpf (ref 0–5)

## 2020-07-07 LAB — URINALYSIS, COMPLETE
Bilirubin, UA: NEGATIVE
Glucose, UA: NEGATIVE
Ketones, UA: NEGATIVE
Leukocytes,UA: NEGATIVE
Nitrite, UA: NEGATIVE
Protein,UA: NEGATIVE
Specific Gravity, UA: 1.01 (ref 1.005–1.030)
Urobilinogen, Ur: 0.2 mg/dL (ref 0.2–1.0)
pH, UA: 5 (ref 5.0–7.5)

## 2020-07-07 NOTE — Patient Instructions (Signed)

## 2020-07-07 NOTE — Progress Notes (Signed)
Acute Office Visit  Subjective:    Patient ID: Fernando Gardner, male    DOB: 03-02-1961, 60 y.o.   MRN: [id number]  Chief Complaint  Patient presents with  . Hematuria    HPI Patient is in today for hematuria x 1 day. He reports that his urine is clear today. Denies dysuria, swelling, discharge, fever, chills, nausea, or vomiting. He has chronic lower back pain but he pain in is back is not any worse or different than usual. Denies abdominal pain. He has had kidney stones in the past and still has a large stone in his left kidney. He had some hematuria about 3 years ago due to kidney stones with no other symptoms. He was previously seeing Dr. Hampton Abbot for urology but he is no longer at the same office in [place]. He is taking Eliquis 5 mg BID daily for bilateral DVTs. He denies warmth or tenderness to the back of his legs. Denies chest pain or shortness of breath.   Past Medical History:  Diagnosis Date  . Allergy   . CHF (congestive heart failure) ([place])   . Chronic kidney disease   . CVA (cerebral vascular accident) ([place])   . Diabetes mellitus without complication ([place])   . Hypertension   . Myocardial infarction ([place])   . Seizures ([place])   . Stroke [facility])    patient stated he has had many mini-strokes  . Thyroid disease     Past Surgical History:  Procedure Laterality Date  . BACK SURGERY     Lower back 2003/2004    Family History  Problem Relation Age of Onset  . Heart disease Father   . Hypertension Father   . Diabetes Brother   . Kidney disease Maternal Aunt   . Kidney disease Maternal Uncle     Social History   Socioeconomic History  . Marital status: Married    Spouse name: Not on file  . Number of children: Not on file  . Years of education: Not on file  . Highest education level: Not on file  Occupational History  . Not on file  Tobacco Use  . Smoking status: Heavy Tobacco Smoker    Packs/day: 1.50    Types: Cigarettes  . Smokeless tobacco: Current User   Vaping Use  . Vaping Use: Never used  Substance and Sexual Activity  . Alcohol use: Yes    Alcohol/week: 2.0 standard drinks    Types: 2 Cans of beer per week  . Drug use: No  . Sexual activity: Not on file  Other Topics Concern  . Not on file  Social History Narrative  . Not on file   Social Determinants of Health   Financial Resource Strain: Not on file  Food Insecurity: Not on file  Transportation Needs: Not on file  Physical Activity: Not on file  Stress: Not on file  Social Connections: Not on file  Intimate Partner Violence: Not on file    Outpatient Medications Prior to Visit  Medication Sig Dispense Refill  . apixaban (ELIQUIS) 5 MG TABS tablet Take 1 tablet (5 mg total) by mouth 2 (two) times daily. 10 mg (2 tablets) oral 2 times a day x 7 days followed by 5 mg (1 tablet) 2 times a day for completion of therapy duration. 60 tablet 5  . atorvastatin (LIPITOR) 20 MG tablet Take 1 tablet (20 mg total) by mouth at bedtime. 90 tablet 1   No facility-administered medications prior to visit.  No Known Allergies  Review of Systems As per HPI.     Objective:    Physical Exam Vitals and nursing note reviewed.  Constitutional:      General: He is not in acute distress.    Appearance: Normal appearance. He is not ill-appearing, toxic-appearing or diaphoretic.  Cardiovascular:     Rate and Rhythm: Normal rate and regular rhythm.     Heart sounds: Normal heart sounds. No murmur heard.   Pulmonary:     Effort: Pulmonary effort is normal. No respiratory distress.     Breath sounds: Normal breath sounds. No wheezing.  Abdominal:     General: Abdomen is flat. There is no distension.     Palpations: Abdomen is soft.     Tenderness: There is no abdominal tenderness. There is no right CVA tenderness, left CVA tenderness, guarding or rebound.  Musculoskeletal:        General: No tenderness.     Right lower leg: No edema.     Left lower leg: No edema.  Skin:     Findings: No erythema.  Neurological:     General: No focal deficit present.     Mental Status: He is alert and oriented to person, place, and time.  Psychiatric:        Mood and Affect: Mood normal.        Behavior: Behavior normal.    Urine dipstick shows positive for RBC's.  Micro exam: 0 WBC's per HPF, 0-2 RBC's per HPF and no bacteria.  BP 130/76   Pulse 60   Temp 98.1 F (36.7 C) (Temporal)   Ht 6\' 1"  (1.854 m)   Wt 192 lb 2 oz (87.1 kg)   BMI 25.35 kg/m  Wt Readings from Last 3 Encounters:  07/07/20 192 lb 2 oz (87.1 kg)  07/03/20 191 lb (86.6 kg)  06/02/20 187 lb 4.8 oz (85 kg)    Health Maintenance Due  Topic Date Due  . COLONOSCOPY (Pts 45-82yrs Insurance coverage will need to be confirmed)  05/14/2018    There are no preventive care reminders to display for this patient.   No results found for: TSH Lab Results  Component Value Date   WBC 5.0 07/03/2020   HGB 14.7 07/03/2020   HCT 44.1 07/03/2020   MCV 91 07/03/2020   PLT 213 07/03/2020   Lab Results  Component Value Date   NA 141 07/03/2020   K 4.2 07/03/2020   CO2 21 07/03/2020   GLUCOSE 85 07/03/2020   BUN 18 07/03/2020   CREATININE 1.44 (H) 07/03/2020   BILITOT 0.4 07/03/2020   ALKPHOS 72 07/03/2020   AST 15 07/03/2020   ALT 14 07/03/2020   PROT 6.8 07/03/2020   ALBUMIN 4.4 07/03/2020   CALCIUM 9.5 07/03/2020   Lab Results  Component Value Date   CHOL 210 (H) 07/03/2020   Lab Results  Component Value Date   HDL 44 07/03/2020   Lab Results  Component Value Date   LDLCALC 135 (H) 07/03/2020   Lab Results  Component Value Date   TRIG 175 (H) 07/03/2020   Lab Results  Component Value Date   CHOLHDL 4.8 07/03/2020   No results found for: HGBA1C     Assessment & Plan:   Fernando Gardner was seen today for hematuria.  Diagnoses and all orders for this visit:  Hematuria, unspecified type x1 day, clear today. 0-2 RBCs on micro today. No indications for infection. No pain. Exam  unremarkable. Discussed referral to urology  vs CT vs observation. He would refer observation for now and will follow up for new or worsening symptoms or if symptoms persist.  -     Urinalysis, Complete  The patient indicates understanding of these issues and agrees with the plan.  Gwenlyn Perking, FNP

## 2020-07-07 NOTE — Addendum Note (Signed)
Addended by: Liliane Bade on: 07/07/2020 02:45 PM   Modules accepted: Orders

## 2020-07-08 LAB — BMP8+EGFR
BUN/Creatinine Ratio: 14 (ref 9–20)
BUN: 15 mg/dL (ref 6–24)
CO2: 19 mmol/L — ABNORMAL LOW (ref 20–29)
Calcium: 9.2 mg/dL (ref 8.7–10.2)
Chloride: 103 mmol/L (ref 96–106)
Creatinine, Ser: 1.04 mg/dL (ref 0.76–1.27)
Glucose: 80 mg/dL (ref 65–99)
Potassium: 4.2 mmol/L (ref 3.5–5.2)
Sodium: 139 mmol/L (ref 134–144)
eGFR: 84 mL/min/{1.73_m2} (ref >59)

## 2020-07-19 DIAGNOSIS — Z1211 Encounter for screening for malignant neoplasm of colon: Secondary | ICD-10-CM | POA: Diagnosis not present

## 2020-07-28 LAB — COLOGUARD: Cologuard: POSITIVE — AB

## 2020-07-31 ENCOUNTER — Other Ambulatory Visit: Payer: Self-pay | Admitting: Family Medicine

## 2020-07-31 DIAGNOSIS — R195 Other fecal abnormalities: Secondary | ICD-10-CM

## 2020-07-31 NOTE — Progress Notes (Signed)
Placed referral for the patient because he had a positive Cologuard to [facility]

## 2020-07-31 NOTE — Progress Notes (Signed)
Lmtcb.

## 2020-09-17 ENCOUNTER — Telehealth: Payer: Self-pay

## 2020-09-17 ENCOUNTER — Encounter: Payer: Self-pay | Admitting: Gastroenterology

## 2020-09-17 ENCOUNTER — Ambulatory Visit (INDEPENDENT_AMBULATORY_CARE_PROVIDER_SITE_OTHER): Payer: BC Managed Care – PPO | Admitting: Gastroenterology

## 2020-09-17 VITALS — BP 138/92 | HR 68 | Ht 73.0 in | Wt 194.2 lb

## 2020-09-17 DIAGNOSIS — K625 Hemorrhage of anus and rectum: Secondary | ICD-10-CM

## 2020-09-17 DIAGNOSIS — R195 Other fecal abnormalities: Secondary | ICD-10-CM | POA: Diagnosis not present

## 2020-09-17 DIAGNOSIS — Z7902 Long term (current) use of antithrombotics/antiplatelets: Secondary | ICD-10-CM

## 2020-09-17 MED ORDER — PLENVU 140 G PO SOLR: 140.0000 g | ORAL | 0 refills | Status: DC

## 2020-09-17 NOTE — Telephone Encounter (Signed)
Patient is 4 months out from DVT, I do agree that Eliquis can be held for 2 days and started back up the next day, low risk because of the recent DVTs.  If the procedure is not are urgent and they can wait until after 6 months we are stopping the Eliquis at 6 months which would be in August.

## 2020-09-17 NOTE — Telephone Encounter (Signed)
Left message to call.

## 2020-09-17 NOTE — Progress Notes (Signed)
[facility] Consult Note:  History: Fernando Gardner 09/17/2020  Referring provider: Dettinger, Fernando Kaufmann, MD  Reason for consult/chief complaint: positive cologuard (Patient with recent positive cologuard testing; on Eliquis prescribed by Dr Warrick Parisian), Rectal Bleeding (Patient notes some "blood clots" after bowel movements at times. Indicates that this has been occurring intermittently for months but happens infrequently. States bleeding can vary in amount but is typically brb.  +rectal pain at times), and Abdominal Pain (LLQ "aggrevating" and uncomfortable pain that is intermittent; occurring x several years )   Subjective  HPI:  This is a 60 year old man accompanied by his wife Fernando Gardner and referred by primary care for positive Cologuard. He has had many years of intermittent rectal bleeding attributed to hemorrhoids.  Colonoscopy in January 2010 by Dr. Deatra Ina removed a small polyp (no pathology report), and reported clinical suspicion of the bleeding being hemorrhoidal in nature (though none are apparent on the photographs in the report). His bowel habits are regular, and he rarely strains for bowel movement.  Bleeding somewhat more often since he started oral anticoagulation after blood clot earlier this year. Intermittent LLQ pain not clearly related to eating or bowel movements. Discharge summary from [facility] January 1 and 2 indicates DVT and thrombophlebitis, treated with IV heparin and then Eliquis.  He saw vascular surgery in January and the plan was for 3 to 6 months of [place] and probable repeat ultrasound to see if the [facility] can be stopped.  His next PCP appointment is August 3.  ROS:  Review of Systems  Constitutional: Negative for appetite change and unexpected weight change.  HENT: Negative for mouth sores and voice change.   Eyes: Negative for pain and redness.  Respiratory: Negative for cough and shortness of breath.   Cardiovascular: Negative for  chest pain and palpitations.  Genitourinary: Negative for dysuria and hematuria.  Musculoskeletal: Positive for back pain. Negative for arthralgias and myalgias.  Skin: Negative for pallor and rash.  Neurological: Negative for weakness and headaches.  Hematological: Negative for adenopathy.     Past Medical History: Past Medical History:  Diagnosis Date  . Allergy   . CHF (congestive heart failure) ([place])   . Chronic kidney disease   . COPD (chronic obstructive pulmonary disease) ([place])   . CVA (cerebral vascular accident) ([place])   . Diabetes mellitus without complication ([place])   . Hypertension   . Kidney stones   . Myocardial infarction ([place])   . Seizures ([place])   . Stroke [facility])    patient stated he has had many mini-strokes  . Thyroid disease    Discharge summary from [facility] hospitalization August 2018 indicates acute ischemic left MCA stroke confirmed on MRI, arrived outside window for tPA.  Echocardiogram with inter atrial shunt, EF 50 to 55%  Past Surgical History: Past Surgical History:  Procedure Laterality Date  . BACK SURGERY     Lower back 2003/2004     Family History: Family History  Problem Relation Age of Onset  . Heart disease Father   . Hypertension Father   . Diabetes Brother   . Kidney disease Maternal Aunt   . Kidney disease Maternal Uncle     Social History: Social History   Socioeconomic History  . Marital status: Married    Spouse name: Not on file  . Number of children: Not on file  . Years of education: Not on file  . Highest education level: Not on file  Occupational History  . Not on  file  Tobacco Use  . Smoking status: Heavy Tobacco Smoker    Packs/day: 1.50    Types: Cigarettes  . Smokeless tobacco: Current User  Vaping Use  . Vaping Use: Never used  Substance and Sexual Activity  . Alcohol use: Yes    Alcohol/week: 14.0 standard drinks    Types: 14 Cans of beer per week  . Drug use: Yes    Types: Marijuana    Comment: daily   . Sexual activity: Not on file  Other Topics Concern  . Not on file  Social History Narrative  . Not on file   Social Determinants of Health   Financial Resource Strain: Not on file  Food Insecurity: Not on file  Transportation Needs: Not on file  Physical Activity: Not on file  Stress: Not on file  Social Connections: Not on file   Has cut EtOh to 2 beers/day Still smoking Mechanic, repairs hydraulic equipment  Allergies: No Known Allergies  Outpatient Meds: Current Outpatient Medications  Medication Sig Dispense Refill  . apixaban (ELIQUIS) 5 MG TABS tablet Take 1 tablet (5 mg total) by mouth 2 (two) times daily. 10 mg (2 tablets) oral 2 times a day x 7 days followed by 5 mg (1 tablet) 2 times a day for completion of therapy duration. (Patient taking differently: Take 5 mg by mouth 2 (two) times daily.) 60 tablet 5  . atorvastatin (LIPITOR) 20 MG tablet Take 1 tablet (20 mg total) by mouth at bedtime. 90 tablet 1  . PEG-KCl-NaCl-NaSulf-Na Asc-C (PLENVU) 140 g SOLR Take 140 g by mouth as directed. 1 each 0   No current facility-administered medications for this visit.      ___________________________________________________________________ Objective   Exam:  BP (!) 138/92   Pulse 68   Ht 6\' 1"  (1.854 m)   Wt 194 lb 3.2 oz (88.1 kg)   BMI 25.62 kg/m  Wt Readings from Last 3 Encounters:  09/17/20 194 lb 3.2 oz (88.1 kg)  07/07/20 192 lb 2 oz (87.1 kg)  07/03/20 191 lb (86.6 kg)     General: Not acutely ill-appearing  Eyes: sclera anicteric, no redness  ENT: oral mucosa moist without lesions, no cervical or supraclavicular lymphadenopathy  CV: RRR without murmur, S1/S2, no JVD, no peripheral edema  Resp: clear to auscultation bilaterally, normal RR and effort noted  GI: soft, no tenderness, with active bowel sounds. No guarding or palpable organomegaly noted.  Skin; warm and dry, no rash or jaundice noted  Neuro: awake, alert and oriented x 3. Normal  gross motor function and fluent speech Rectal normal, heme-negative  Labs:  CBC Latest Ref Rng & Units 07/03/2020  WBC 3.4 - 10.8 x10E3/uL 5.0  Hemoglobin 13.0 - 17.7 g/dL 14.7  Hematocrit 37.5 - 51.0 % 44.1  Platelets 150 - 450 x10E3/uL 213   CMP Latest Ref Rng & Units 07/07/2020 07/03/2020  Glucose 65 - 99 mg/dL 80 85  BUN 6 - 24 mg/dL 15 18  Creatinine 0.76 - 1.27 mg/dL 1.04 1.44(H)  Sodium 134 - 144 mmol/L 139 141  Potassium 3.5 - 5.2 mmol/L 4.2 4.2  Chloride 96 - 106 mmol/L 103 103  CO2 20 - 29 mmol/L 19(L) 21  Calcium 8.7 - 10.2 mg/dL 9.2 9.5  Total Protein 6.0 - 8.5 g/dL - 6.8  Total Bilirubin 0.0 - 1.2 mg/dL - 0.4  Alkaline Phos 44 - 121 IU/L - 72  AST 0 - 40 IU/L - 15  ALT 0 - 44 IU/L -  14   Positive cologuard March 2022 (ordered by PCP at patient's preference)   Assessment: Encounter Diagnoses  Name Primary?  . Positive colorectal cancer screening using Cologuard test Yes  . Rectal bleeding   . Long term (current) use of antithrombotics/antiplatelets     Many years of rectal bleeding without reported constipation, no hemorrhoids in 1761, but certainly may have developed in the interim. Unprovoked DVT earlier this year, suspicion seems to been perhaps related to a viral illness (?  COVID) soon before that.  On [place] for 6 months, which will be early July.  Significance of his positive Cologuard is unknown at present, could be occult blood, precancerous polyps or cancer. I recommended he have a colonoscopy and that it not wait until July or August when he might be off oral anticoagulation.  Procedure described in detail along with risks and benefits and he was agreeable.  The benefits and risks of the planned procedure were described in detail with the patient or (when appropriate) their health care proxy.  Risks were outlined as including, but not limited to, bleeding, infection, perforation, adverse medication reaction leading to cardiac or pulmonary decompensation,  pancreatitis (if ERCP).  The limitation of incomplete mucosal visualization was also discussed.  No guarantees or warranties were given.  Last dose Eliquis 2 evenings prior to procedure.  He understands the small but real risk of DVT/PE during the short period of time off the medicine.  We will communicate with primary care about this and see if they feel bridging Lovenox therapy is needed and/or repeat ultrasound to see if persistent DVTs.   Thank you for the courtesy of this consult.  Please call me with any questions or concerns.  Nelida Meuse III  CC: Referring provider noted above

## 2020-09-17 NOTE — Telephone Encounter (Signed)
Dr. Warrick Parisian, please review and send route back to Magdalene River, CMA     Request for surgical clearance:     Endoscopy Procedure ( Colonoscopy)   When is this surgery scheduled?     10-09-2020  What type of clearance is required ?   Pharmacy  Are there any medications that need to be held prior to surgery and how long? Yes, we would like to hold the Eliquis 2 days before the scheduled colonoscopy.  Practice name and name of physician performing surgery?      [facility]  What is your office phone and fax number?      Phone- [phone]  [phone]  Anesthesia type (None, local, MAC, general) ?       MAC  Thank you,   Vivien Rota, CMA

## 2020-09-17 NOTE — Patient Instructions (Signed)
If you are age 58 or older, your body mass index should be between 23-30. Your Body mass index is 25.62 kg/m. If this is out of the aforementioned range listed, please consider follow up with your Primary Care Provider.  If you are age 13 or younger, your body mass index should be between 19-25. Your Body mass index is 25.62 kg/m. If this is out of the aformentioned range listed, please consider follow up with your Primary Care Provider.   It was a pleasure to see you today!  Thank you for trusting me with your gastrointestinal care!

## 2020-09-17 NOTE — Telephone Encounter (Signed)
Dr. Warrick Parisian,  Thanks so much for the prompt reply. While Dorsey's procedure is not urgent, I am uncomfortable waiting until August for a positive Cologuard test.  We will proceed as scheduled on June 2, and have him take his last dose of Eliquis 2 evenings prior.  ____________________  Vivien Rota,  Please see above.  - HD

## 2020-09-19 NOTE — Telephone Encounter (Signed)
Left message for pt to return call.

## 2020-09-19 NOTE — Telephone Encounter (Signed)
Pt has been notified and aware. No additional questions

## 2020-10-09 ENCOUNTER — Ambulatory Visit (AMBULATORY_SURGERY_CENTER): Payer: BC Managed Care – PPO | Admitting: Gastroenterology

## 2020-10-09 ENCOUNTER — Other Ambulatory Visit: Payer: Self-pay

## 2020-10-09 ENCOUNTER — Encounter: Payer: Self-pay | Admitting: Gastroenterology

## 2020-10-09 VITALS — BP 108/75 | HR 55 | Temp 98.9°F | Resp 18 | Ht 73.0 in | Wt 194.0 lb

## 2020-10-09 DIAGNOSIS — D124 Benign neoplasm of descending colon: Secondary | ICD-10-CM | POA: Diagnosis not present

## 2020-10-09 DIAGNOSIS — K625 Hemorrhage of anus and rectum: Secondary | ICD-10-CM

## 2020-10-09 DIAGNOSIS — K5730 Diverticulosis of large intestine without perforation or abscess without bleeding: Secondary | ICD-10-CM | POA: Diagnosis not present

## 2020-10-09 DIAGNOSIS — K640 First degree hemorrhoids: Secondary | ICD-10-CM | POA: Diagnosis not present

## 2020-10-09 DIAGNOSIS — D122 Benign neoplasm of ascending colon: Secondary | ICD-10-CM | POA: Diagnosis not present

## 2020-10-09 DIAGNOSIS — R195 Other fecal abnormalities: Secondary | ICD-10-CM

## 2020-10-09 MED ORDER — SODIUM CHLORIDE 0.9 % IV SOLN: 500.0000 mL | Freq: Once | INTRAVENOUS | Status: DC

## 2020-10-09 NOTE — Progress Notes (Signed)
VS by CW  Pt's states no medical or surgical changes since previsit or office visit.  

## 2020-10-09 NOTE — Progress Notes (Signed)
PT taken to PACU. Monitors in place. VSS. Report given to RN. 

## 2020-10-09 NOTE — Progress Notes (Signed)
Called to room to assist during endoscopic procedure.  Patient ID and intended procedure confirmed with present staff. Received instructions for my participation in the procedure from the performing physician.  

## 2020-10-09 NOTE — Patient Instructions (Addendum)
HANDOUTS PROVIDED ON: POLYPS, DIVERTICULOSIS, & HEMORRHOIDS  The polyps removed today have been sent for pathology.  The results can take 1-3 weeks to receive.  When your next colonoscopy should occur will be based on the pathology results.    You may resume your previous diet and medication schedule EXCEPT your Eliquis.  That may be resumed tomorrow, 10/10/20.  Thank you for allowing [place] to care for you today!!!   YOU HAD AN ENDOSCOPIC PROCEDURE TODAY AT [facility]:   Refer to the procedure report that was given to you for any specific questions about what was found during the examination.  If the procedure report does not answer your questions, please call your gastroenterologist to clarify.  If you requested that your care partner not be given the details of your procedure findings, then the procedure report has been included in a sealed envelope for you to review at your convenience later.  YOU SHOULD EXPECT: Some feelings of bloating in the abdomen. Passage of more gas than usual.  Walking can help get rid of the air that was put into your GI tract during the procedure and reduce the bloating. If you had a lower endoscopy (such as a colonoscopy or flexible sigmoidoscopy) you may notice spotting of blood in your stool or on the toilet paper. If you underwent a bowel prep for your procedure, you may not have a normal bowel movement for a few days.  Please Note:  You might notice some irritation and congestion in your nose or some drainage.  This is from the oxygen used during your procedure.  There is no need for concern and it should clear up in a day or so.  SYMPTOMS TO REPORT IMMEDIATELY:   Following lower endoscopy (colonoscopy or flexible sigmoidoscopy):  Excessive amounts of blood in the stool  Significant tenderness or worsening of abdominal pains  Swelling of the abdomen that is new, acute  Fever of 100F or higher  For urgent or emergent issues, a gastroenterologist  can be reached at any hour by calling [phone]. Do not use MyChart messaging for urgent concerns.    DIET:  We do recommend a small meal at first, but then you may proceed to your regular diet.  Drink plenty of fluids but you should avoid alcoholic beverages for 24 hours.  ACTIVITY:  You should plan to take it easy for the rest of today and you should NOT DRIVE or use heavy machinery until tomorrow (because of the sedation medicines used during the test).    FOLLOW UP: Our staff will call the number listed on your records Monday morning between 7:15 am and 8:15 am following your procedure to check on you and address any questions or concerns that you may have regarding the information given to you following your procedure. If we do not reach you, we will leave a message.  We will attempt to reach you two times.  During this call, we will ask if you have developed any symptoms of COVID 19. If you develop any symptoms (ie: fever, flu-like symptoms, shortness of breath, cough etc.) before then, please call [phone].  If you test positive for Covid 19 in the 2 weeks post procedure, please call and report this information to [place].    If any biopsies were taken you will be contacted by phone or by letter within the next 1-3 weeks.  Please call us at [phone] if you have not heard about the biopsies in  3 weeks.    SIGNATURES/CONFIDENTIALITY: You and/or your care partner have signed paperwork which will be entered into your electronic medical record.  These signatures attest to the fact that that the information above on your After Visit Summary has been reviewed and is understood.  Full responsibility of the confidentiality of this discharge information lies with you and/or your care-partner.

## 2020-10-09 NOTE — Op Note (Signed)
[facility] Patient Name: Fernando Gardner Procedure Date: 10/09/2020 3:29 PM MRN: [id number] Endoscopist: Mallie Mussel L. Loletha Carrow , MD Age: 60 Referring MD:  Date of Birth: Jun 05, 1960 Gender: Male Account #: [id number] Procedure:                Colonoscopy Indications:              Rectal bleeding (years, suspected hemorrhoidal),                            Positive Cologuard test Medicines:                Monitored Anesthesia Care Procedure:                Pre-Anesthesia Assessment:                           - Prior to the procedure, a History and Physical                            was performed, and patient medications and                            allergies were reviewed. The patient's tolerance of                            previous anesthesia was also reviewed. The risks                            and benefits of the procedure and the sedation                            options and risks were discussed with the patient.                            All questions were answered, and informed consent                            was obtained. Prior Anticoagulants: The patient has                            taken Eliquis (apixaban), last dose was 2 days                            prior to procedure. ASA Grade Assessment: III - A                            patient with severe systemic disease. After                            reviewing the risks and benefits, the patient was                            deemed in satisfactory condition to undergo the  procedure.                           After obtaining informed consent, the colonoscope                            was passed under direct vision. Throughout the                            procedure, the patient's blood pressure, pulse, and                            oxygen saturations were monitored continuously. The                            Olympus CF-HQ190 [id number]) Colonoscope was                             introduced through the anus and advanced to the the                            cecum, identified by appendiceal orifice and                            ileocecal valve. The colonoscopy was performed                            without difficulty. The patient tolerated the                            procedure well. The quality of the bowel                            preparation was excellent. The ileocecal valve,                            appendiceal orifice, and rectum were photographed. Scope In: 3:51:55 PM Scope Out: 4:12:06 PM Scope Withdrawal Time: 0 hours 17 minutes 59 seconds  Total Procedure Duration: 0 hours 20 minutes 11 seconds  Findings:                 The perianal exam findings include internal                            hemorrhoids (Grade I).                           Two sessile polyps were found in the distal                            descending colon and ascending colon. The polyps                            were 2 to 4 mm in size. These polyps were removed  with a cold snare. Resection and retrieval were                            complete.                           A 10 mm polyp was found in the distal ascending                            colon. The polyp was semi-sessile. The polyp was                            removed with a piecemeal technique using a cold                            snare. Resection and retrieval were complete.                           Multiple diverticula were found in the left colon.                           Internal hemorrhoids were found. The hemorrhoids                            were large.                           The exam was otherwise without abnormality on                            direct and retroflexion views. Complications:            No immediate complications. Estimated Blood Loss:     Estimated blood loss was minimal. Impression:               - Internal hemorrhoids (Grade I) found on perianal                             exam.                           - Two 2 to 4 mm polyps in the distal descending                            colon and in the ascending colon, removed with a                            cold snare. Resected and retrieved.                           - One 10 mm polyp in the distal ascending colon,                            removed piecemeal using a cold snare. Resected and  retrieved.                           - Diverticulosis in the left colon.                           - Internal hemorrhoids.                           - The examination was otherwise normal on direct                            and retroflexion views. Recommendation:           - Patient has a contact number available for                            emergencies. The signs and symptoms of potential                            delayed complications were discussed with the                            patient. Return to normal activities tomorrow.                            Written discharge instructions were provided to the                            patient.                           - Resume previous diet.                           - Resume Eliquis (apixaban) at prior dose tomorrow.                           - Await pathology results.                           - Repeat colonoscopy is recommended for                            surveillance. The colonoscopy date will be                            determined after pathology results from today's                            exam become available for review.                           - Return to my office if hemorrhoidal banding                            desired - preferably off Eliquis, if it is  to be                            permanently discontinued after 6 months (to be                            determined at next PCP appointment). Mane Consolo L. Loletha Carrow, MD 10/09/2020 4:23:29 PM This report has been signed electronically.

## 2020-10-13 ENCOUNTER — Telehealth: Payer: Self-pay

## 2020-10-13 NOTE — Telephone Encounter (Signed)
  Follow up Call-  Call back number 10/09/2020  Post procedure Call Back phone  # [phone]  Permission to leave phone message Yes  Some recent data might be hidden     Patient questions:  Do you have a fever, pain , or abdominal swelling? No. Pain Score  0 *  Have you tolerated food without any problems? Yes.    Have you been able to return to your normal activities? Yes.    Do you have any questions about your discharge instructions: Diet   No. Medications  No. Follow up visit  No.  Do you have questions or concerns about your Care? No.  Actions: * If pain score is 4 or above: No action needed, pain <4.  1. Have you developed a fever since your procedure? no  2.   Have you had an respiratory symptoms (SOB or cough) since your procedure? no  3.   Have you tested positive for COVID 19 since your procedure no  4.   Have you had any family members/close contacts diagnosed with the COVID 19 since your procedure?  no   If yes to any of these questions please route to Joylene John, RN and Joella Prince, RN

## 2020-10-16 ENCOUNTER — Encounter: Payer: Self-pay | Admitting: Gastroenterology

## 2020-10-30 ENCOUNTER — Telehealth: Payer: Self-pay | Admitting: Gastroenterology

## 2020-10-30 NOTE — Telephone Encounter (Signed)
Spoke with patient's wife in regards to patient's pathology results. No letter received, will mail letter. Wife verbalized understanding and had no concerns at the end of the call.

## 2020-10-30 NOTE — Telephone Encounter (Signed)
Lm on vm for patient to return call 

## 2020-10-30 NOTE — Telephone Encounter (Signed)
Patient wife calling for patient to discuss colon report frm 10/09/20.. Plz advise thanks

## 2020-12-10 ENCOUNTER — Ambulatory Visit (INDEPENDENT_AMBULATORY_CARE_PROVIDER_SITE_OTHER): Payer: BC Managed Care – PPO | Admitting: Family Medicine

## 2020-12-10 ENCOUNTER — Encounter: Payer: Self-pay | Admitting: Family Medicine

## 2020-12-10 ENCOUNTER — Other Ambulatory Visit: Payer: Self-pay

## 2020-12-10 VITALS — BP 120/79 | HR 69 | Ht 73.0 in | Wt 185.0 lb

## 2020-12-10 DIAGNOSIS — Z86718 Personal history of other venous thrombosis and embolism: Secondary | ICD-10-CM

## 2020-12-10 NOTE — Progress Notes (Signed)
BP 120/79   Pulse 69   Ht '6\' 1"'$  (1.854 m)   Wt 185 lb (83.9 kg)   SpO2 96%   BMI 24.41 kg/m    Subjective:   Patient ID: [name], male    DOB: 07-Jan-1961, 60 y.o.   MRN: [id number]  HPI: RUSH BRUST is a 60 y.o. male presenting on 12/10/2020 for DVT (BLE/Taking Eliquis BId)   HPI Dvt recheck Patient is coming in today for DVT recheck.  He has been on Eliquis for 6 months for DVT, he has been doing well and denies any bleeding issues.  He says the swelling in his legs is completely gone and the burning is gone as well.  He denies any major issues  Relevant past medical, surgical, family and social history reviewed and updated as indicated. Interim medical history since our last visit reviewed. Allergies and medications reviewed and updated.  Review of Systems  Constitutional:  Negative for chills and fever.  Eyes:  Negative for visual disturbance.  Respiratory:  Negative for shortness of breath and wheezing.   Cardiovascular:  Negative for chest pain and leg swelling.  Musculoskeletal:  Negative for back pain and gait problem.  Skin:  Negative for rash.  Neurological:  Negative for dizziness, weakness and light-headedness.  All other systems reviewed and are negative.  Per HPI unless specifically indicated above   Allergies as of 12/10/2020   No Known Allergies      Medication List        Accurate as of December 10, 2020  3:23 PM. If you have any questions, ask your nurse or doctor.          atorvastatin 20 MG tablet Commonly known as: LIPITOR Take 1 tablet (20 mg total) by mouth at bedtime.   Eliquis 5 MG Tabs tablet Generic drug: apixaban Take 5 mg by mouth 2 (two) times daily. What changed: Another medication with the same name was removed. Continue taking this medication, and follow the directions you see here. Changed by: Fransisca Kaufmann Nikitta Sobiech, MD         Objective:   BP 120/79   Pulse 69   Ht '6\' 1"'$  (1.854 m)   Wt 185 lb (83.9 kg)   SpO2 96%    BMI 24.41 kg/m   Wt Readings from Last 3 Encounters:  12/10/20 185 lb (83.9 kg)  10/09/20 194 lb (88 kg)  09/17/20 194 lb 3.2 oz (88.1 kg)    Physical Exam Vitals and nursing note reviewed.  Constitutional:      General: He is not in acute distress.    Appearance: He is well-developed. He is not diaphoretic.  Neck:     Thyroid: No thyromegaly.  Musculoskeletal:        General: No swelling or tenderness.  Skin:    General: Skin is warm and dry.     Findings: No bruising or rash.  Neurological:     Mental Status: He is alert and oriented to person, place, and time.     Coordination: Coordination normal.  Psychiatric:        Behavior: Behavior normal.      Assessment & Plan:   Problem List Items Addressed This Visit       Other   History of DVT (deep vein thrombosis) - Primary    Patient will stop Eliquis now, its been more than 6 months and he has no signs of new DVTs or anything else and his swelling and  everything has improved.  We will watch for any future signs Follow up plan: Return in about 6 months (around 06/12/2021), or if symptoms worsen or fail to improve, for physical.  Counseling provided for all of the vaccine components No orders of the defined types were placed in this encounter.   Caryl Pina, MD [facility] 12/10/2020, 3:23 PM

## 2020-12-11 ENCOUNTER — Other Ambulatory Visit: Payer: Self-pay | Admitting: Family Medicine

## 2020-12-29 ENCOUNTER — Other Ambulatory Visit: Payer: Self-pay | Admitting: Family Medicine

## 2021-06-12 ENCOUNTER — Ambulatory Visit (INDEPENDENT_AMBULATORY_CARE_PROVIDER_SITE_OTHER): Payer: BC Managed Care – PPO | Admitting: Family Medicine

## 2021-06-12 ENCOUNTER — Encounter: Payer: Self-pay | Admitting: Family Medicine

## 2021-06-12 VITALS — BP 142/91 | HR 58 | Ht 73.0 in | Wt 188.0 lb

## 2021-06-12 DIAGNOSIS — Z0000 Encounter for general adult medical examination without abnormal findings: Secondary | ICD-10-CM | POA: Diagnosis not present

## 2021-06-12 DIAGNOSIS — Z23 Encounter for immunization: Secondary | ICD-10-CM | POA: Diagnosis not present

## 2021-06-12 DIAGNOSIS — K219 Gastro-esophageal reflux disease without esophagitis: Secondary | ICD-10-CM

## 2021-06-12 DIAGNOSIS — Z0001 Encounter for general adult medical examination with abnormal findings: Secondary | ICD-10-CM | POA: Diagnosis not present

## 2021-06-12 DIAGNOSIS — E782 Mixed hyperlipidemia: Secondary | ICD-10-CM

## 2021-06-12 DIAGNOSIS — E785 Hyperlipidemia, unspecified: Secondary | ICD-10-CM | POA: Insufficient documentation

## 2021-06-12 MED ORDER — ATORVASTATIN CALCIUM 20 MG PO TABS: ORAL_TABLET | ORAL | 3 refills | Status: DC

## 2021-06-12 NOTE — Progress Notes (Signed)
BP (!) 142/91    Pulse (!) 58    Ht '6\' 1"'  (1.854 m)    Wt 188 lb (85.3 kg)    SpO2 96%    BMI 24.80 kg/m    Subjective:   Patient ID: Fernando Gardner, male    DOB: [birth year], 61 y.o.   MRN: [id number]  HPI: Fernando Gardner is a 61 y.o. male presenting on 06/12/2021 for Medical Management of Chronic Issues (CPE)   HPI Adult well exam Patient denies any chest pain, shortness of breath, headaches or vision issues, abdominal complaints, diarrhea, nausea, vomiting, or joint issues.   GERD Patient is currently on no medication currently denies any issues.  She denies any major symptoms or abdominal pain or belching or burping. She denies any blood in her stool or lightheadedness or dizziness.   Hyperlipidemia Patient is coming in for recheck of his hyperlipidemia. The patient is currently taking atorvastatin. They deny any issues with myalgias or history of liver damage from it. They deny any focal numbness or weakness or chest pain.   Relevant past medical, surgical, family and social history reviewed and updated as indicated. Interim medical history since our last visit reviewed. Allergies and medications reviewed and updated.  Review of Systems  Constitutional:  Negative for chills and fever.  HENT:  Negative for ear pain and tinnitus.   Eyes:  Negative for pain and discharge.  Respiratory:  Negative for cough, shortness of breath and wheezing.   Cardiovascular:  Negative for chest pain, palpitations and leg swelling.  Gastrointestinal:  Negative for abdominal pain, blood in stool, constipation and diarrhea.  Genitourinary:  Negative for dysuria and hematuria.  Musculoskeletal:  Negative for back pain, gait problem and myalgias.  Skin:  Negative for rash.  Neurological:  Negative for dizziness, weakness and headaches.  Psychiatric/Behavioral:  Negative for suicidal ideas.   All other systems reviewed and are negative.  Per HPI unless specifically indicated above   Allergies as of  06/12/2021   No Known Allergies      Medication List        Accurate as of June 12, 2021  3:28 PM. If you have any questions, ask your nurse or doctor.          STOP taking these medications    Eliquis 5 MG Tabs tablet Generic drug: apixaban Stopped by: Fransisca Kaufmann Osten Janek, MD       TAKE these medications    atorvastatin 20 MG tablet Commonly known as: LIPITOR TAKE 1 TABLET BY MOUTH EVERYDAY AT BEDTIME         Objective:   BP (!) 142/91    Pulse (!) 58    Ht '6\' 1"'  (1.854 m)    Wt 188 lb (85.3 kg)    SpO2 96%    BMI 24.80 kg/m   Wt Readings from Last 3 Encounters:  06/12/21 188 lb (85.3 kg)  12/10/20 185 lb (83.9 kg)  10/09/20 194 lb (88 kg)    Physical Exam Vitals and nursing note reviewed.  Constitutional:      General: He is not in acute distress.    Appearance: He is well-developed. He is not diaphoretic.  Eyes:     General: No scleral icterus.    Conjunctiva/sclera: Conjunctivae normal.  Neck:     Thyroid: No thyromegaly.  Cardiovascular:     Rate and Rhythm: Normal rate and regular rhythm.     Heart sounds: Normal heart sounds. No murmur heard. Pulmonary:  Effort: Pulmonary effort is normal. No respiratory distress.     Breath sounds: Normal breath sounds. No wheezing.  Musculoskeletal:        General: Normal range of motion.     Cervical back: Neck supple.  Lymphadenopathy:     Cervical: No cervical adenopathy.  Skin:    General: Skin is warm and dry.     Findings: No rash.  Neurological:     Mental Status: He is alert and oriented to person, place, and time.     Coordination: Coordination normal.  Psychiatric:        Behavior: Behavior normal.      Assessment & Plan:   Problem List Items Addressed This Visit       Digestive   Esophageal reflux   Relevant Orders   CBC with Differential/Platelet     Other   Hyperlipidemia   Relevant Medications   atorvastatin (LIPITOR) 20 MG tablet   Other Relevant Orders   CMP14+EGFR    Lipid panel   Other Visit Diagnoses     Well adult exam    -  Primary   Relevant Orders   CBC with Differential/Platelet   CMP14+EGFR   Need for Tdap vaccination       Relevant Orders   Tdap vaccine greater than or equal to 7yo IM (Completed)        Follow up plan: Return if symptoms worsen or fail to improve.  Counseling provided for all of the vaccine components Orders Placed This Encounter  Procedures   Tdap vaccine greater than or equal to 7yo IM   CBC with Differential/Platelet   CMP14+EGFR   Lipid panel    Caryl Pina, MD [facility] 06/12/2021, 3:28 PM

## 2021-06-13 LAB — CBC WITH DIFFERENTIAL/PLATELET
Basophils Absolute: 0 10*3/uL (ref 0.0–0.2)
Basos: 1 %
EOS (ABSOLUTE): 0.1 10*3/uL (ref 0.0–0.4)
Eos: 2 %
Hematocrit: 44.5 % (ref 37.5–51.0)
Hemoglobin: 15.2 g/dL (ref 13.0–17.7)
Immature Grans (Abs): 0 10*3/uL (ref 0.0–0.1)
Immature Granulocytes: 0 %
Lymphocytes Absolute: 1.3 10*3/uL (ref 0.7–3.1)
Lymphs: 26 %
MCH: 31.9 pg (ref 26.6–33.0)
MCHC: 34.2 g/dL (ref 31.5–35.7)
MCV: 94 fL (ref 79–97)
Monocytes Absolute: 0.4 10*3/uL (ref 0.1–0.9)
Monocytes: 8 %
Neutrophils Absolute: 3.1 10*3/uL (ref 1.4–7.0)
Neutrophils: 63 %
Platelets: 145 10*3/uL — ABNORMAL LOW (ref 150–450)
RBC: 4.76 x10E6/uL (ref 4.14–5.80)
RDW: 13.4 % (ref 11.6–15.4)
WBC: 5 10*3/uL (ref 3.4–10.8)

## 2021-06-13 LAB — LIPID PANEL
Chol/HDL Ratio: 2.9 ratio (ref 0.0–5.0)
Cholesterol, Total: 174 mg/dL (ref 100–199)
HDL: 61 mg/dL (ref >39)
LDL Chol Calc (NIH): 93 mg/dL (ref 0–99)
Triglycerides: 111 mg/dL (ref 0–149)
VLDL Cholesterol Cal: 20 mg/dL (ref 5–40)

## 2021-06-13 LAB — CMP14+EGFR
ALT: 26 IU/L (ref 0–44)
AST: 22 IU/L (ref 0–40)
Albumin/Globulin Ratio: 2.1 (ref 1.2–2.2)
Albumin: 4.4 g/dL (ref 3.8–4.9)
Alkaline Phosphatase: 56 IU/L (ref 44–121)
BUN/Creatinine Ratio: 13 (ref 9–20)
BUN: 15 mg/dL (ref 6–24)
Bilirubin Total: 0.4 mg/dL (ref 0.0–1.2)
CO2: 21 mmol/L (ref 20–29)
Calcium: 9.1 mg/dL (ref 8.7–10.2)
Chloride: 106 mmol/L (ref 96–106)
Creatinine, Ser: 1.2 mg/dL (ref 0.76–1.27)
Globulin, Total: 2.1 g/dL (ref 1.5–4.5)
Glucose: 97 mg/dL (ref 70–99)
Potassium: 4.1 mmol/L (ref 3.5–5.2)
Sodium: 142 mmol/L (ref 134–144)
Total Protein: 6.5 g/dL (ref 6.0–8.5)
eGFR: 70 mL/min/{1.73_m2} (ref >59)

## 2021-07-24 ENCOUNTER — Encounter: Payer: Self-pay | Admitting: Family Medicine

## 2021-07-24 ENCOUNTER — Ambulatory Visit (INDEPENDENT_AMBULATORY_CARE_PROVIDER_SITE_OTHER): Payer: BC Managed Care – PPO | Admitting: Family Medicine

## 2021-07-24 VITALS — BP 131/80 | HR 68 | Temp 97.8°F | Ht 73.0 in | Wt 189.4 lb

## 2021-07-24 DIAGNOSIS — M5416 Radiculopathy, lumbar region: Secondary | ICD-10-CM | POA: Diagnosis not present

## 2021-07-24 MED ORDER — METHYLPREDNISOLONE ACETATE 40 MG/ML IJ SUSP
40.0000 mg | Freq: Once | INTRAMUSCULAR | Status: AC
  Administered 2021-07-24: 40 mg via INTRAMUSCULAR

## 2021-07-24 MED ORDER — KETOROLAC TROMETHAMINE 30 MG/ML IJ SOLN
30.0000 mg | Freq: Once | INTRAMUSCULAR | Status: AC
  Administered 2021-07-24: 30 mg via INTRAMUSCULAR

## 2021-07-24 MED ORDER — PREDNISONE 20 MG PO TABS: 40.0000 mg | ORAL_TABLET | Freq: Every day | ORAL | 0 refills | Status: AC

## 2021-07-24 NOTE — Progress Notes (Signed)
?  ? ?Subjective:  ?Patient ID: Fernando Gardner, male    DOB: 1961/03/11, 61 y.o.   MRN: [id number] ? ?Patient Care Team: ?Dettinger, Fransisca Kaufmann, MD as PCP - General (Family Medicine)  ? ?Chief Complaint:  Back Pain ? ? ?HPI: ?Fernando Gardner is a 61 y.o. male presenting on 07/24/2021 for Back Pain ? ? ?Patient presents today with lower back pain that radiates down right leg.  States this started last Wednesday and became worse after heavy object at work. ? ?Back Pain ?This is a new problem. The current episode started 1 to 4 weeks ago. The problem occurs daily. The problem has been waxing and waning since onset. The pain is present in the lumbar spine. The quality of the pain is described as aching, shooting and burning. The pain radiates to the right thigh. The pain is at a severity of 6/10. The pain is moderate. The symptoms are aggravated by bending, position, stress, twisting and standing. Associated symptoms include paresthesias and tingling. Pertinent negatives include no abdominal pain, bladder incontinence, bowel incontinence, chest pain, dysuria, fever, headaches, leg pain, numbness, paresis, pelvic pain, perianal numbness, weakness or weight loss. He has tried NSAIDs, ice and heat for the symptoms. The treatment provided mild relief.  ? ? ? ?Relevant past medical, surgical, family, and social history reviewed and updated as indicated.  ?Allergies and medications reviewed and updated. Data reviewed: Chart in Epic. ? ? ?Past Medical History:  ?Diagnosis Date  ? Allergy   ? CHF (congestive heart failure) ([place])   ? Chronic kidney disease   ? COPD (chronic obstructive pulmonary disease) ([place])   ? CVA (cerebral vascular accident) [facility])   ? Diabetes mellitus without complication ([place])   ? Hypertension   ? Kidney stones   ? Myocardial infarction [facility])   ? Seizures ([place])   ? Stroke [facility])   ? patient stated he has had many mini-strokes  ? Thyroid disease   ? ? ?Past Surgical History:  ?Procedure Laterality Date  ? BACK  SURGERY    ? Lower back 2003/2004  ? ? ?Social History  ? ?Socioeconomic History  ? Marital status: Married  ?  Spouse name: Not on file  ? Number of children: Not on file  ? Years of education: Not on file  ? Highest education level: Not on file  ?Occupational History  ? Not on file  ?Tobacco Use  ? Smoking status: Heavy Smoker  ?  Packs/day: 1.50  ?  Types: Cigarettes  ? Smokeless tobacco: Current  ?Vaping Use  ? Vaping Use: Never used  ?Substance and Sexual Activity  ? Alcohol use: Yes  ?  Alcohol/week: 14.0 standard drinks  ?  Types: 14 Cans of beer per week  ? Drug use: Yes  ?  Types: Marijuana  ?  Comment: daily  ? Sexual activity: Not on file  ?Other Topics Concern  ? Not on file  ?Social History Narrative  ? Not on file  ? ?Social Determinants of Health  ? ?Financial Resource Strain: Not on file  ?Food Insecurity: Not on file  ?Transportation Needs: Not on file  ?Physical Activity: Not on file  ?Stress: Not on file  ?Social Connections: Not on file  ?Intimate Partner Violence: Not on file  ? ? ?Outpatient Encounter Medications as of 07/24/2021  ?Medication Sig  ? atorvastatin (LIPITOR) 20 MG tablet TAKE 1 TABLET BY MOUTH EVERYDAY AT BEDTIME  ? ibuprofen (ADVIL) 200 MG tablet Take 200 mg by mouth  every 6 (six) hours as needed.  ? predniSONE (DELTASONE) 20 MG tablet Take 2 tablets (40 mg total) by mouth daily with breakfast for 5 days.  ? ?No facility-administered encounter medications on file as of 07/24/2021.  ? ? ?No Known Allergies ? ?Review of Systems  ?Constitutional:  Negative for activity change, appetite change, chills, diaphoresis, fatigue, fever, unexpected weight change and weight loss.  ?HENT: Negative.    ?Eyes: Negative.   ?Respiratory:  Negative for cough, chest tightness and shortness of breath.   ?Cardiovascular:  Negative for chest pain, palpitations and leg swelling.  ?Gastrointestinal:  Negative for abdominal pain, blood in stool, bowel incontinence, constipation, diarrhea, nausea and  vomiting.  ?Endocrine: Negative.   ?Genitourinary:  Negative for bladder incontinence, decreased urine volume, difficulty urinating, dysuria, frequency, pelvic pain and urgency.  ?Musculoskeletal:  Positive for back pain. Negative for arthralgias, gait problem, joint swelling, myalgias, neck pain and neck stiffness.  ?Skin: Negative.   ?Allergic/Immunologic: Negative.   ?Neurological:  Positive for tingling and paresthesias. Negative for dizziness, weakness, numbness and headaches.  ?Hematological: Negative.   ?Psychiatric/Behavioral:  Negative for confusion, hallucinations, sleep disturbance and suicidal ideas.   ?All other systems reviewed and are negative. ? ?   ? ?Objective:  ?BP 131/80   Pulse 68   Temp 97.8 ?F (36.6 ?C) (Temporal)   Ht '6\' 1"'$  (1.854 m)   Wt 189 lb 6 oz (85.9 kg)   BMI 24.99 kg/m?   ? ?Wt Readings from Last 3 Encounters:  ?07/24/21 189 lb 6 oz (85.9 kg)  ?06/12/21 188 lb (85.3 kg)  ?12/10/20 185 lb (83.9 kg)  ? ? ?Physical Exam ?Vitals and nursing note reviewed.  ?Constitutional:   ?   General: He is not in acute distress. ?   Appearance: Normal appearance. He is well-developed, well-groomed and normal weight. He is not ill-appearing, toxic-appearing or diaphoretic.  ?HENT:  ?   Head: Normocephalic and atraumatic.  ?   Jaw: There is normal jaw occlusion.  ?   Right Ear: Hearing normal.  ?   Left Ear: Hearing normal.  ?   Nose: Nose normal.  ?   Mouth/Throat:  ?   Lips: Pink.  ?   Mouth: Mucous membranes are moist.  ?   Pharynx: Oropharynx is clear. Uvula midline.  ?Eyes:  ?   General: Lids are normal.  ?   Extraocular Movements: Extraocular movements intact.  ?   Conjunctiva/sclera: Conjunctivae normal.  ?   Pupils: Pupils are equal, round, and reactive to light.  ?Neck:  ?   Thyroid: No thyroid mass, thyromegaly or thyroid tenderness.  ?   Vascular: No carotid bruit or JVD.  ?   Trachea: Trachea and phonation normal.  ?Cardiovascular:  ?   Rate and Rhythm: Normal rate and regular rhythm.   ?   Chest Wall: PMI is not displaced.  ?   Pulses: Normal pulses.  ?   Heart sounds: Normal heart sounds. No murmur heard. ?  No friction rub. No gallop.  ?Pulmonary:  ?   Effort: Pulmonary effort is normal. No respiratory distress.  ?   Breath sounds: Normal breath sounds. No wheezing.  ?Abdominal:  ?   General: Bowel sounds are normal. There is no distension or abdominal bruit.  ?   Palpations: Abdomen is soft. There is no hepatomegaly or splenomegaly.  ?   Tenderness: There is no abdominal tenderness. There is no right CVA tenderness or left CVA tenderness.  ?  Hernia: No hernia is present.  ?Musculoskeletal:  ?   Cervical back: Normal range of motion and neck supple.  ?   Thoracic back: Normal.  ?   Lumbar back: Tenderness present. No swelling, edema, deformity, signs of trauma, lacerations, spasms or bony tenderness. Decreased range of motion. Positive right straight leg raise test. Negative left straight leg raise test. No scoliosis.  ?   Right hip: Normal.  ?   Left hip: Normal.  ?   Right lower leg: No edema.  ?   Left lower leg: No edema.  ?Lymphadenopathy:  ?   Cervical: No cervical adenopathy.  ?Skin: ?   General: Skin is warm and dry.  ?   Capillary Refill: Capillary refill takes less than 2 seconds.  ?   Coloration: Skin is not cyanotic, jaundiced or pale.  ?   Findings: No rash.  ?Neurological:  ?   General: No focal deficit present.  ?   Mental Status: He is alert and oriented to person, place, and time.  ?   Sensory: Sensation is intact.  ?   Motor: Motor function is intact.  ?   Coordination: Coordination is intact.  ?   Gait: Gait is intact.  ?   Deep Tendon Reflexes: Reflexes are normal and symmetric.  ?Psychiatric:     ?   Attention and Perception: Attention and perception normal.     ?   Mood and Affect: Mood and affect normal.     ?   Speech: Speech normal.     ?   Behavior: Behavior normal. Behavior is cooperative.     ?   Thought Content: Thought content normal.     ?   Cognition and  Memory: Cognition and memory normal.     ?   Judgment: Judgment normal.  ? ? ?Results for orders placed or performed in visit on 06/12/21  ?CBC with Differential/Platelet  ?Result Value Ref Range  ? WBC 5.0 3.4 - 10.8 x10

## 2022-01-04 ENCOUNTER — Telehealth: Payer: Self-pay

## 2022-01-04 NOTE — Chronic Care Management (AMB) (Signed)
  Care Management   Outreach Note  01/04/2022 Name: Fernando Gardner MRN: [id number] DOB: [birth year]  An unsuccessful telephone outreach was attempted today. The patient was referred to the case management team for assistance with care management and care coordination.   Follow Up Plan:  A HIPAA compliant phone message was left for the patient providing contact information and requesting a return call.  The care management team will reach out to the patient again over the next 7 days.  If patient returns call to provider office, please advise to call [place]* at [phone]*  Noreene Larsson, [place], [place] [address] Direct Dial: [phone] [email or web address]

## 2022-01-12 NOTE — Chronic Care Management (AMB) (Signed)
  Care Coordination  Outreach Note  01/12/2022 Name: Fernando Gardner MRN: [id number] DOB: 1960/08/09   Care Coordination Outreach Attempts  A second unsuccessful outreach was attempted today to offer the patient with information about available care coordination services as a benefit of their health plan.     Follow Up Plan:  Additional outreach attempts will be made to offer the patient care coordination information and services.   Encounter Outcome:  No Answer  Sig Noreene Larsson, [place], [place] [address] Direct Dial: [phone] [email or web address]

## 2022-01-25 ENCOUNTER — Other Ambulatory Visit: Payer: Self-pay | Admitting: *Deleted

## 2022-01-25 NOTE — Patient Outreach (Signed)
  Care Coordination   01/25/2022  Name: Fernando Gardner MRN: [id number] DOB: 08/24/1960   Care Coordination Outreach Attempts:  A third unsuccessful outreach was attempted today to offer the patient with information about available care coordination services as a benefit of their health plan. HIPAA compliant messages left on voicemail, providing contact information for CSW, encouraging patient to return CSW's call at his earliest convenience.  Follow Up Plan:  No further outreach attempts will be made at this time. We have been unable to contact the patient to offer or enroll patient in care coordination services.  Encounter Outcome:  No Answer.   Care Coordination Interventions Activated:  No.    Care Coordination Interventions:  No, not indicated.    Nat Christen, BSW, MSW, LCSW  Licensed Education officer, [facility]  Mailing [address] [place], [place] [address] Physical Address-[address], [place], [place] [address] Toll Free Main # [phone] Fax # [phone] Cell # [phone] [email or web address]

## 2022-03-24 IMAGING — US US EXTREM LOW VENOUS*L*
1 series · 13 of 24 positions shown · non-contrast
Comparison: None.

CLINICAL DATA: Recent diagnosis of DVT with left lower extremity
redness and edema



[Series 1: us venous img lower uni left (dvt) · portal-venous · 13 of 34 slices shown]
[im 1/34]
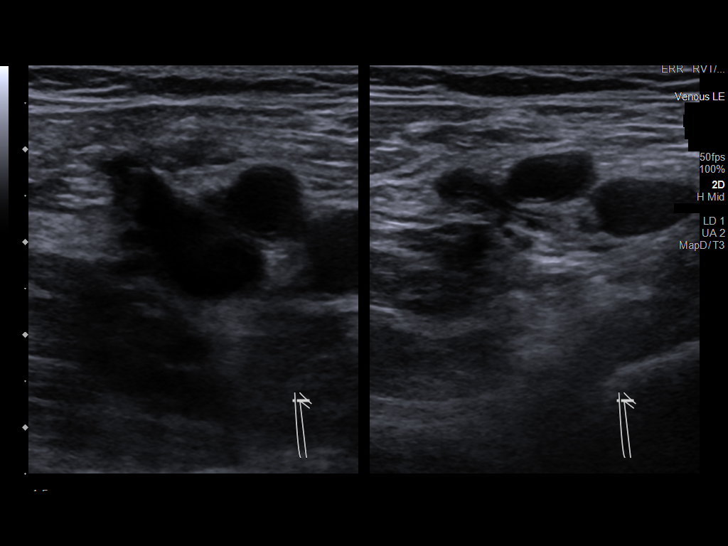
[im 3/34]
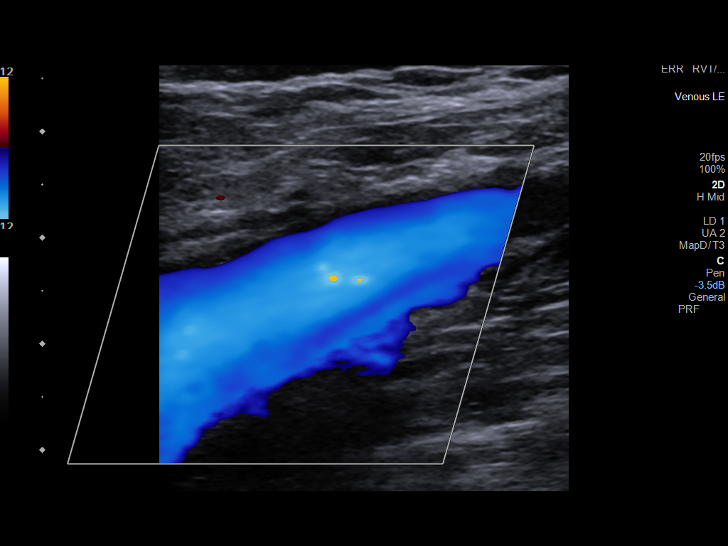
[im 6/34]
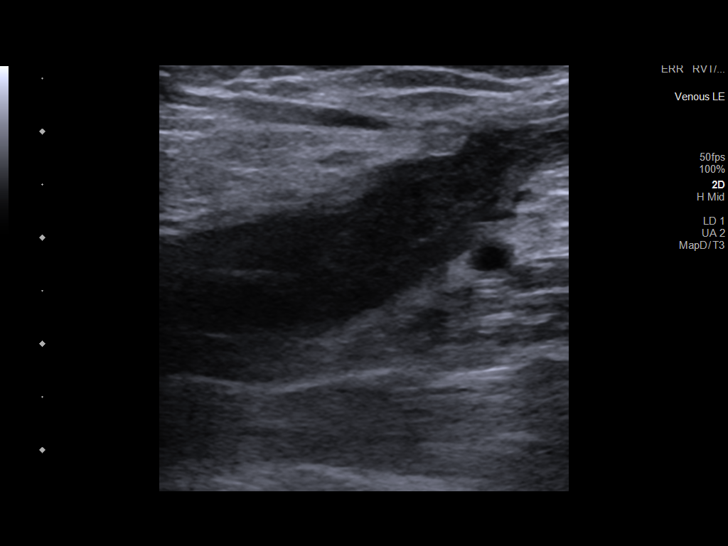
[im 9/34]
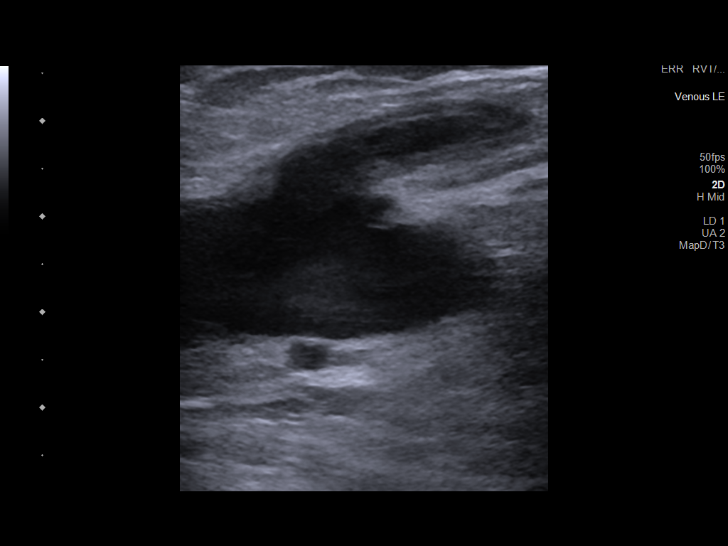
[im 12/34]
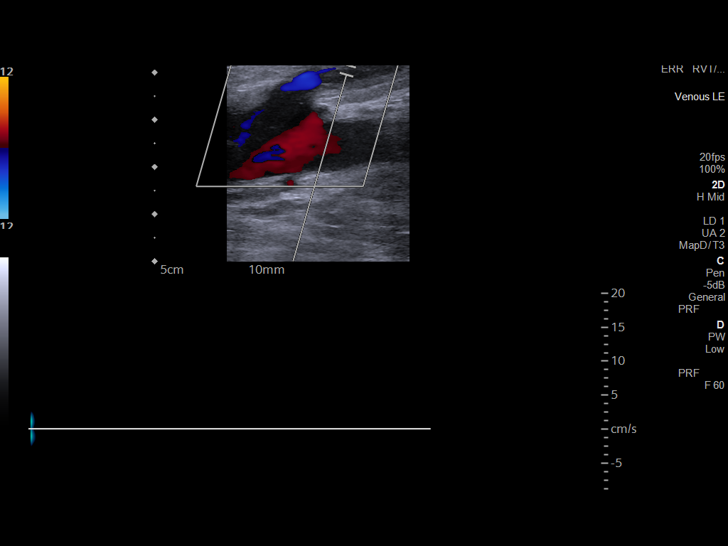
[im 15/34]
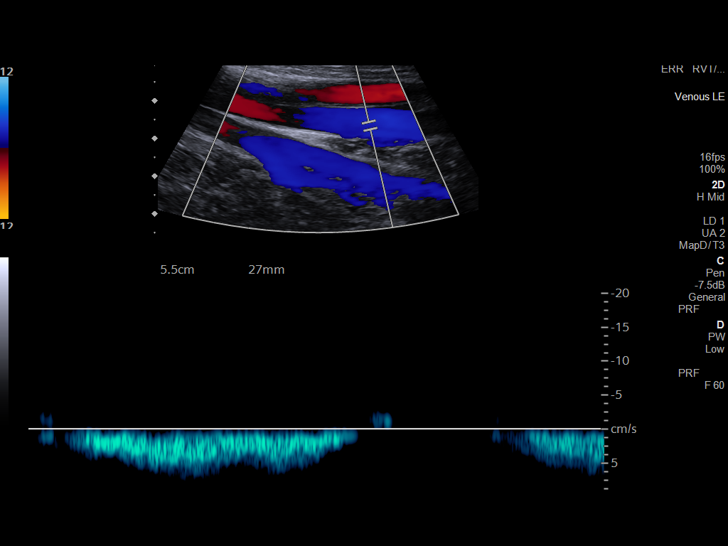
[im 18/34]
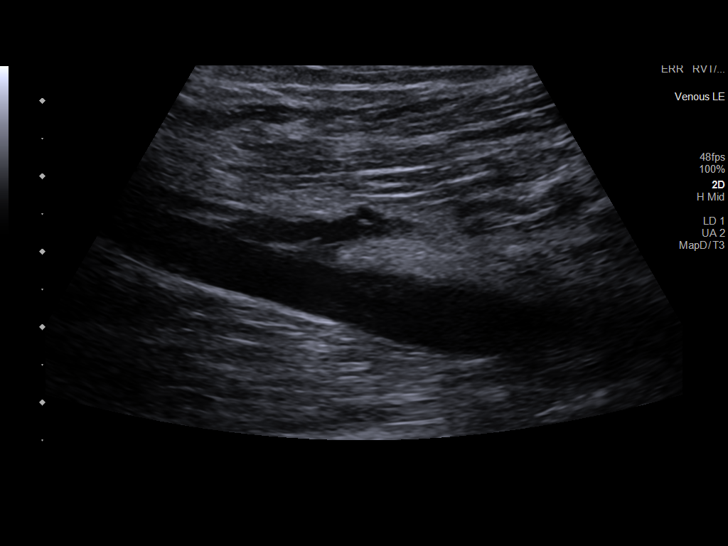
[im 19/34]
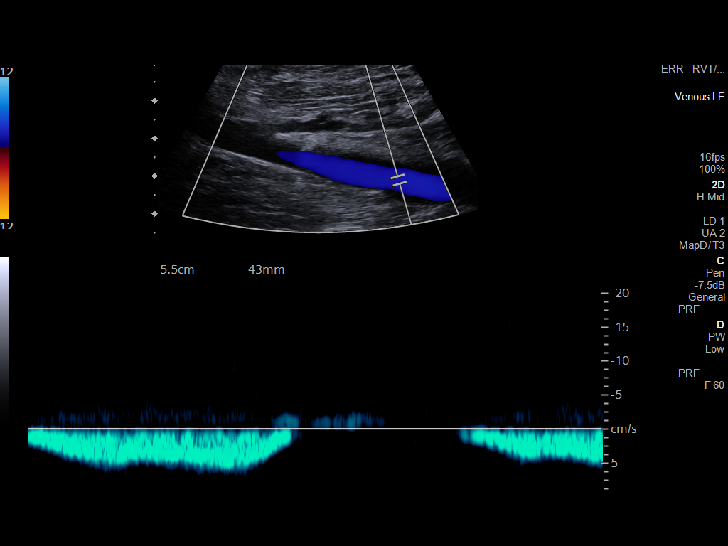
[im 22/34]
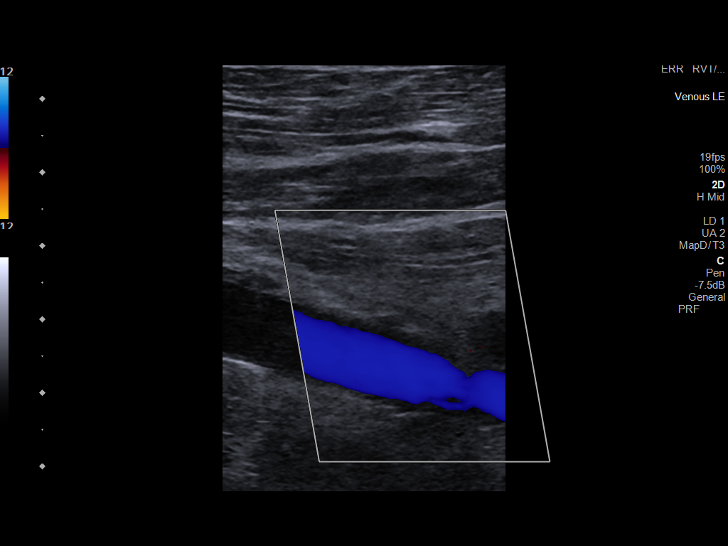
[im 25/34]
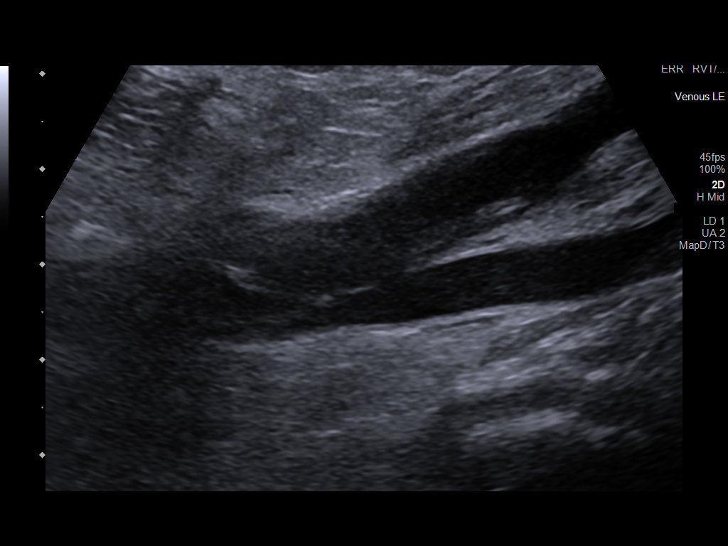
[im 28/34]
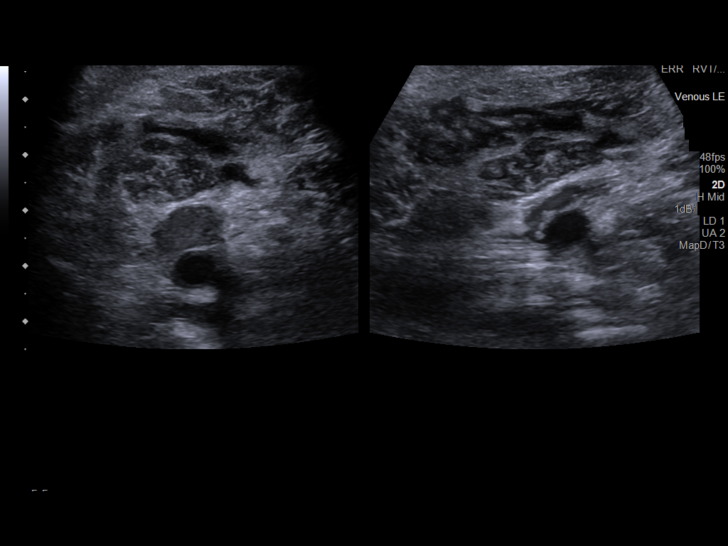
[im 31/34]
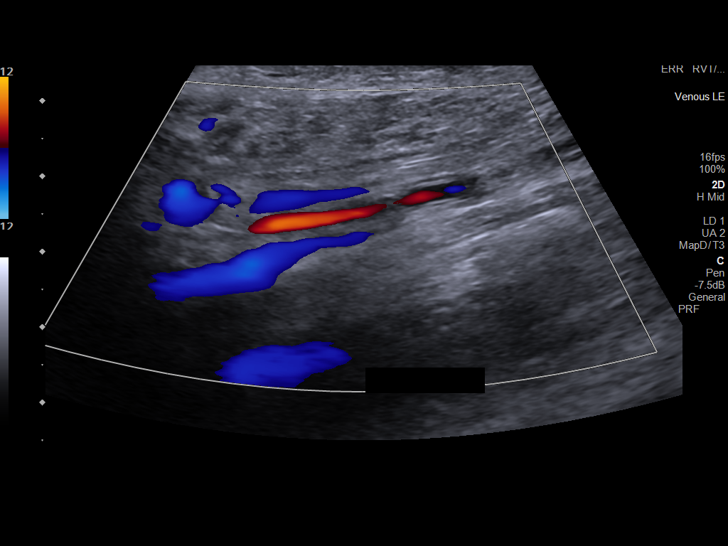
[im 34/34]
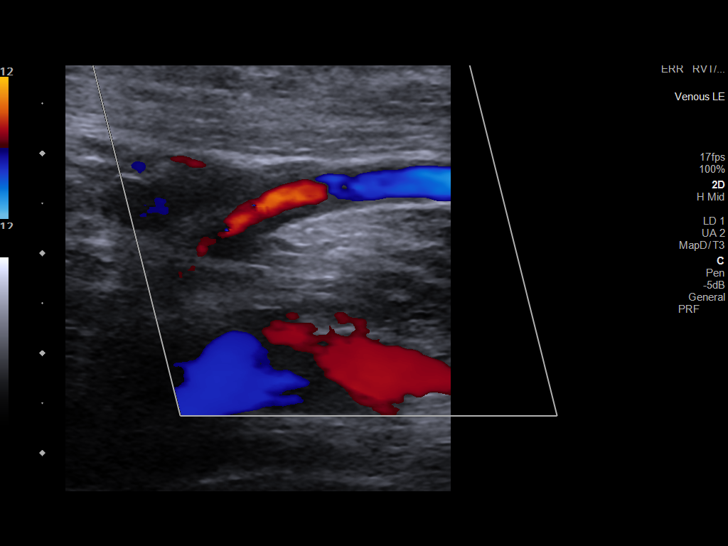

[13 of 24 positions shown; findings below may reference images not displayed]

FINDINGS: Contralateral Common Femoral Vein: Respiratory phasicity is normal
and symmetric with the symptomatic side. No evidence of thrombus.
Normal compressibility.

Common Femoral Vein: The common femoral vein is partially
compressible. There is eccentric wall thickening consistent with
residual thrombus. Positive color flow is noted on color Doppler
imaging. Thrombus extends into the saphenofemoral junction.

Saphenofemoral Junction: Thrombus extends into the saphenofemoral
junction.

Profunda Femoral Vein: No evidence of thrombus. Normal
compressibility and flow on color Doppler imaging.

Femoral Vein: No evidence of thrombus. Normal compressibility,
respiratory phasicity and response to augmentation.

Popliteal Vein: The popliteal vein is not compressible. The lumen is
expanded and filled with low-level internal echoes. No evidence of
color flow on color Doppler imaging. Findings are consistent with
acute occlusive DVT.

Calf Veins: Thrombus extends into the small saphenous vein.

Superficial Great Saphenous Vein: No evidence of thrombus. Normal
compressibility.

Venous Reflux:  None.

Other Findings:  None.
IMPRESSION: 1. Positive for acute occlusive DVT in the popliteal vein.
2. There is associated superficial venous thrombosis in the small
saphenous vein.
3. Positive for nonocclusive DVT in the common femoral vein
extending into the saphenofemoral junction.

## 2022-07-18 ENCOUNTER — Other Ambulatory Visit: Payer: Self-pay | Admitting: Family Medicine

## 2022-07-19 ENCOUNTER — Encounter: Payer: Self-pay | Admitting: Family Medicine

## 2022-07-19 NOTE — Telephone Encounter (Signed)
LMTCB TO SCHEDULE APPT LETTER MAILED 

## 2022-07-19 NOTE — Telephone Encounter (Signed)
30 day supply sent in-please schedule CPE with PCP for further refills

## 2022-08-02 ENCOUNTER — Other Ambulatory Visit: Payer: Self-pay | Admitting: Family Medicine

## 2022-08-02 ENCOUNTER — Encounter: Payer: Self-pay | Admitting: Family Medicine

## 2022-08-02 NOTE — Telephone Encounter (Signed)
LMTCB to schedule an appt w/Dettinger in May 2024 for Med refill. Also, sending pt a letter about this.

## 2022-08-02 NOTE — Telephone Encounter (Signed)
Dettinger. NTBS 30 days given 07/19/22

## 2023-02-16 ENCOUNTER — Ambulatory Visit: Payer: BC Managed Care – PPO | Admitting: Family Medicine

## 2023-02-16 ENCOUNTER — Encounter: Payer: Self-pay | Admitting: Family Medicine

## 2023-02-16 VITALS — BP 146/68 | HR 50 | Ht 73.0 in | Wt 183.0 lb

## 2023-02-16 DIAGNOSIS — Z125 Encounter for screening for malignant neoplasm of prostate: Secondary | ICD-10-CM | POA: Diagnosis not present

## 2023-02-16 DIAGNOSIS — Z0001 Encounter for general adult medical examination with abnormal findings: Secondary | ICD-10-CM | POA: Diagnosis not present

## 2023-02-16 DIAGNOSIS — Z131 Encounter for screening for diabetes mellitus: Secondary | ICD-10-CM

## 2023-02-16 DIAGNOSIS — E782 Mixed hyperlipidemia: Secondary | ICD-10-CM | POA: Diagnosis not present

## 2023-02-16 DIAGNOSIS — Z0000 Encounter for general adult medical examination without abnormal findings: Secondary | ICD-10-CM

## 2023-02-16 NOTE — Progress Notes (Signed)
BP (!) 146/68   Pulse (!) 50   Ht 6\' 1"  (1.854 m)   Wt 183 lb (83 kg)   SpO2 98%   BMI 24.14 kg/m    Subjective:   Patient ID: Fernando Gardner, male    DOB: [birth year], 62 y.o.   MRN: [id number]  HPI: RICKEY SADOWSKI is a 62 y.o. male presenting on 02/16/2023 for Medical Management of Chronic Issues, Hypertension, and Hyperlipidemia   HPI Physical exam Patient denies any chest pain, shortness of breath, headaches or vision issues, abdominal complaints, diarrhea, nausea, vomiting, or joint issues.  Patient still smoking and has no desire to quit  Hypertension Patient is currently on no medicine currently, has been resistant towards medicines, and their blood pressure today is 146/68. Patient denies any lightheadedness or dizziness. Patient denies headaches, blurred vision, chest pains, shortness of breath, or weakness. Denies any side effects from medication and is content with current medication.   Hyperlipidemia Patient is coming in for recheck of his hyperlipidemia. The patient is currently taking no medicine, stopped the atorvastatin because of sexual side effects. They deny any issues with myalgias or history of liver damage from it. They deny any focal numbness or weakness or chest pain.   Relevant past medical, surgical, family and social history reviewed and updated as indicated. Interim medical history since our last visit reviewed. Allergies and medications reviewed and updated.  Review of Systems  Constitutional:  Negative for chills and fever.  HENT:  Negative for ear pain and tinnitus.   Eyes:  Negative for pain and discharge.  Respiratory:  Negative for cough, shortness of breath and wheezing.   Cardiovascular:  Negative for chest pain, palpitations and leg swelling.  Gastrointestinal:  Negative for abdominal pain, blood in stool, constipation and diarrhea.  Genitourinary:  Negative for dysuria and hematuria.  Musculoskeletal:  Negative for back pain, gait problem and  myalgias.  Skin:  Negative for rash.  Neurological:  Negative for dizziness, weakness and headaches.  Psychiatric/Behavioral:  Negative for suicidal ideas.   All other systems reviewed and are negative.   Per HPI unless specifically indicated above   Allergies as of 02/16/2023   No Known Allergies      Medication List        Accurate as of February 16, 2023  2:38 PM. If you have any questions, ask your nurse or doctor.          STOP taking these medications    atorvastatin 20 MG tablet Commonly known as: LIPITOR Stopped by: Elige Radon Arihant Pennings       TAKE these medications    ibuprofen 200 MG tablet Commonly known as: ADVIL Take 200 mg by mouth every 6 (six) hours as needed.         Objective:   BP (!) 146/68   Pulse (!) 50   Ht 6\' 1"  (1.854 m)   Wt 183 lb (83 kg)   SpO2 98%   BMI 24.14 kg/m   Wt Readings from Last 3 Encounters:  02/16/23 183 lb (83 kg)  07/24/21 189 lb 6 oz (85.9 kg)  06/12/21 188 lb (85.3 kg)    Physical Exam Vitals and nursing note reviewed.  Constitutional:      General: He is not in acute distress.    Appearance: Normal appearance. He is well-developed. He is not diaphoretic.  HENT:     Right Ear: External ear normal.     Left Ear: External ear normal.  Nose: Nose normal.     Mouth/Throat:     Pharynx: No oropharyngeal exudate.  Eyes:     General: No scleral icterus.       Right eye: No discharge.     Conjunctiva/sclera: Conjunctivae normal.     Pupils: Pupils are equal, round, and reactive to light.  Neck:     Thyroid: No thyromegaly.  Cardiovascular:     Rate and Rhythm: Normal rate and regular rhythm.     Heart sounds: Normal heart sounds. No murmur heard. Pulmonary:     Effort: Pulmonary effort is normal. No respiratory distress.     Breath sounds: Normal breath sounds. No wheezing.  Abdominal:     General: Bowel sounds are normal. There is no distension.     Palpations: Abdomen is soft.     Tenderness:  There is no abdominal tenderness. There is no guarding or rebound.  Musculoskeletal:        General: Normal range of motion.     Cervical back: Neck supple.  Lymphadenopathy:     Cervical: No cervical adenopathy.  Skin:    General: Skin is warm and dry.     Findings: No rash.  Neurological:     Mental Status: He is alert and oriented to person, place, and time.     Coordination: Coordination normal.  Psychiatric:        Behavior: Behavior normal.       Assessment & Plan:   Problem List Items Addressed This Visit       Other   Hyperlipidemia   Relevant Orders   CMP14+EGFR   Lipid panel   Other Visit Diagnoses     Physical exam    -  Primary   Relevant Orders   CBC with Differential/Platelet   CMP14+EGFR   Lipid panel   Diabetes mellitus screening       Relevant Orders   CBC with Differential/Platelet   CMP14+EGFR   Prostate cancer screening       Relevant Orders   PSA, total and free     Will recheck blood work.  His blood pressure is up slightly and likely his cholesterol is up because been off of the medicine for a while.  If it is out then we will try something like Crestor instead of the Lipitor and see if he does little bit better with it.  For the blood pressure is going to check it over the next 2 weeks and call or send me some numbers. No desire to quit smoking Follow up plan: Return in about 6 months (around 08/17/2023), or if symptoms worsen or fail to improve, for Hypertension and cholesterol recheck.  Counseling provided for all of the vaccine components Orders Placed This Encounter  Procedures   CBC with Differential/Platelet   CMP14+EGFR   Lipid panel   PSA, total and free    Arville Care, MD [facility] 02/16/2023, 2:38 PM

## 2023-02-17 LAB — CMP14+EGFR
ALT: 20 [IU]/L (ref 0–44)
AST: 20 [IU]/L (ref 0–40)
Albumin: 4.4 g/dL (ref 3.8–4.9)
Alkaline Phosphatase: 48 [IU]/L (ref 44–121)
BUN/Creatinine Ratio: 14 (ref 10–24)
BUN: 17 mg/dL (ref 8–27)
Bilirubin Total: 0.6 mg/dL (ref 0.0–1.2)
CO2: 21 mmol/L (ref 20–29)
Calcium: 9.7 mg/dL (ref 8.6–10.2)
Chloride: 104 mmol/L (ref 96–106)
Creatinine, Ser: 1.18 mg/dL (ref 0.76–1.27)
Globulin, Total: 2.2 g/dL (ref 1.5–4.5)
Glucose: 80 mg/dL (ref 70–99)
Potassium: 4.4 mmol/L (ref 3.5–5.2)
Sodium: 140 mmol/L (ref 134–144)
Total Protein: 6.6 g/dL (ref 6.0–8.5)
eGFR: 71 mL/min/{1.73_m2} (ref >59)

## 2023-02-17 LAB — CBC WITH DIFFERENTIAL/PLATELET
Basophils Absolute: 0 10*3/uL (ref 0.0–0.2)
Basos: 1 %
EOS (ABSOLUTE): 0.1 10*3/uL (ref 0.0–0.4)
Eos: 3 %
Hematocrit: 49.9 % (ref 37.5–51.0)
Hemoglobin: 16.8 g/dL (ref 13.0–17.7)
Immature Grans (Abs): 0 10*3/uL (ref 0.0–0.1)
Immature Granulocytes: 0 %
Lymphocytes Absolute: 1.3 10*3/uL (ref 0.7–3.1)
Lymphs: 23 %
MCH: 32.9 pg (ref 26.6–33.0)
MCHC: 33.7 g/dL (ref 31.5–35.7)
MCV: 98 fL — ABNORMAL HIGH (ref 79–97)
Monocytes Absolute: 0.5 10*3/uL (ref 0.1–0.9)
Monocytes: 9 %
Neutrophils Absolute: 3.7 10*3/uL (ref 1.4–7.0)
Neutrophils: 64 %
Platelets: 142 10*3/uL — ABNORMAL LOW (ref 150–450)
RBC: 5.11 x10E6/uL (ref 4.14–5.80)
RDW: 13.2 % (ref 11.6–15.4)
WBC: 5.6 10*3/uL (ref 3.4–10.8)

## 2023-02-17 LAB — LIPID PANEL
Chol/HDL Ratio: 3.6 {ratio} (ref 0.0–5.0)
Cholesterol, Total: 222 mg/dL — ABNORMAL HIGH (ref 100–199)
HDL: 61 mg/dL (ref >39)
LDL Chol Calc (NIH): 131 mg/dL — ABNORMAL HIGH (ref 0–99)
Triglycerides: 170 mg/dL — ABNORMAL HIGH (ref 0–149)
VLDL Cholesterol Cal: 30 mg/dL (ref 5–40)

## 2023-02-17 LAB — PSA, TOTAL AND FREE
PSA, Free Pct: 34.3 %
PSA, Free: 0.24 ng/mL
Prostate Specific Ag, Serum: 0.7 ng/mL (ref 0.0–4.0)

## 2023-03-01 ENCOUNTER — Other Ambulatory Visit: Payer: Self-pay

## 2023-03-01 MED ORDER — EZETIMIBE 10 MG PO TABS: 10.0000 mg | ORAL_TABLET | Freq: Every evening | ORAL | 3 refills | Status: AC

## 2023-05-27 DIAGNOSIS — I6501 Occlusion and stenosis of right vertebral artery: Secondary | ICD-10-CM | POA: Diagnosis not present

## 2023-05-27 DIAGNOSIS — R200 Anesthesia of skin: Secondary | ICD-10-CM | POA: Diagnosis not present

## 2023-05-27 DIAGNOSIS — R4789 Other speech disturbances: Secondary | ICD-10-CM | POA: Diagnosis not present

## 2023-05-27 DIAGNOSIS — I639 Cerebral infarction, unspecified: Secondary | ICD-10-CM | POA: Diagnosis not present

## 2023-05-27 DIAGNOSIS — F17210 Nicotine dependence, cigarettes, uncomplicated: Secondary | ICD-10-CM | POA: Diagnosis not present

## 2023-05-27 DIAGNOSIS — Z79899 Other long term (current) drug therapy: Secondary | ICD-10-CM | POA: Diagnosis not present

## 2023-05-27 DIAGNOSIS — I517 Cardiomegaly: Secondary | ICD-10-CM | POA: Diagnosis not present

## 2023-05-27 DIAGNOSIS — Z7982 Long term (current) use of aspirin: Secondary | ICD-10-CM | POA: Diagnosis not present

## 2023-05-27 DIAGNOSIS — I371 Nonrheumatic pulmonary valve insufficiency: Secondary | ICD-10-CM | POA: Diagnosis not present

## 2023-05-27 DIAGNOSIS — Z888 Allergy status to other drugs, medicaments and biological substances status: Secondary | ICD-10-CM | POA: Diagnosis not present

## 2023-05-27 DIAGNOSIS — I6523 Occlusion and stenosis of bilateral carotid arteries: Secondary | ICD-10-CM | POA: Diagnosis not present

## 2023-05-27 DIAGNOSIS — G930 Cerebral cysts: Secondary | ICD-10-CM | POA: Diagnosis not present

## 2023-05-27 DIAGNOSIS — R9082 White matter disease, unspecified: Secondary | ICD-10-CM | POA: Diagnosis not present

## 2023-05-27 DIAGNOSIS — R29810 Facial weakness: Secondary | ICD-10-CM | POA: Diagnosis not present

## 2023-05-27 DIAGNOSIS — E785 Hyperlipidemia, unspecified: Secondary | ICD-10-CM | POA: Diagnosis not present

## 2023-05-27 DIAGNOSIS — Q248 Other specified congenital malformations of heart: Secondary | ICD-10-CM | POA: Diagnosis not present

## 2023-05-27 DIAGNOSIS — R4781 Slurred speech: Secondary | ICD-10-CM | POA: Diagnosis not present

## 2023-05-27 DIAGNOSIS — F1010 Alcohol abuse, uncomplicated: Secondary | ICD-10-CM | POA: Diagnosis not present

## 2023-05-27 DIAGNOSIS — I10 Essential (primary) hypertension: Secondary | ICD-10-CM | POA: Diagnosis not present

## 2023-05-28 DIAGNOSIS — I639 Cerebral infarction, unspecified: Secondary | ICD-10-CM | POA: Diagnosis not present

## 2023-05-28 DIAGNOSIS — R2243 Localized swelling, mass and lump, lower limb, bilateral: Secondary | ICD-10-CM | POA: Diagnosis not present

## 2023-06-16 ENCOUNTER — Encounter: Payer: Self-pay | Admitting: Family Medicine

## 2023-06-16 ENCOUNTER — Ambulatory Visit: Payer: BC Managed Care – PPO | Admitting: Family Medicine

## 2023-06-16 VITALS — BP 135/85 | HR 58 | Ht 73.0 in | Wt 186.0 lb

## 2023-06-16 DIAGNOSIS — Z8673 Personal history of transient ischemic attack (TIA), and cerebral infarction without residual deficits: Secondary | ICD-10-CM

## 2023-06-16 DIAGNOSIS — Z0279 Encounter for issue of other medical certificate: Secondary | ICD-10-CM

## 2023-06-16 NOTE — Progress Notes (Signed)
 BP 135/85   Pulse (!) 58   Ht 6' 1 (1.854 m)   Wt 186 lb (84.4 kg)   SpO2 96%   BMI 24.54 kg/m    Subjective:   Patient ID: Franky JONETTA Sar, male    DOB: 11-May-1960, 63 y.o.   MRN: [id number]  HPI: Fernando Gardner is a 63 y.o. male presenting on 06/16/2023 for Hospitalization Follow-up (Stroke)   HPI S/p CVA and hospital follow-up Patient was in the hospital on 05/27/2023 and discharged on 05/28/2023. Patient is coming in today for stroke follow-up.  Patient was diagnosed with a right frontal stroke.  He had speech difficulty and left hand weakness which both are resolved and back to normal.  He has not needed any outpatient physical therapy.  He is taking aspirin.  They did try giving Plavix but he said he got shortness of breath swelling with the Plavix so they stopped that one of the aspirin.  He does [facility]-up [facility] 06/28/2018 5 and 9 AM.  Relevant past medical, surgical, family and social history reviewed and updated as indicated. Interim medical history since our last visit reviewed. Allergies and medications reviewed and updated.  Review of Systems  Constitutional:  Negative for chills and fever.  Respiratory:  Negative for shortness of breath and wheezing.   Cardiovascular:  Negative for chest pain and leg swelling.  Musculoskeletal:  Negative for arthralgias, back pain, gait problem and myalgias.  Skin:  Negative for rash.  Neurological:  Negative for weakness.  All other systems reviewed and are negative.   Per HPI unless specifically indicated above   Allergies as of 06/16/2023   No Known Allergies      Medication List        Accurate as of June 16, 2023  3:12 PM. If you have any questions, ask your nurse or doctor.          aspirin EC 81 MG tablet Take 81 mg by mouth daily. Swallow whole.   ezetimibe  10 MG tablet Commonly known as: Zetia  Take 1 tablet (10 mg total) by mouth at bedtime.   ibuprofen 200 MG tablet Commonly  known as: ADVIL Take 200 mg by mouth every 6 (six) hours as needed.         Objective:   BP 135/85   Pulse (!) 58   Ht 6' 1 (1.854 m)   Wt 186 lb (84.4 kg)   SpO2 96%   BMI 24.54 kg/m   Wt Readings from Last 3 Encounters:  06/16/23 186 lb (84.4 kg)  02/16/23 183 lb (83 kg)  07/24/21 189 lb 6 oz (85.9 kg)    Physical Exam Vitals and nursing note reviewed.  Constitutional:      General: He is not in acute distress.    Appearance: He is well-developed. He is not diaphoretic.  HENT:     Mouth/Throat:     Mouth: Mucous membranes are moist.     Pharynx: Oropharynx is clear. No oropharyngeal exudate or posterior oropharyngeal erythema.  Eyes:     General: No scleral icterus.    Extraocular Movements: Extraocular movements intact.     Conjunctiva/sclera: Conjunctivae normal.     Pupils: Pupils are equal, round, and reactive to light.  Neck:     Thyroid : No thyromegaly.  Cardiovascular:     Rate and Rhythm: Normal rate and regular rhythm.     Heart sounds: Normal heart sounds. No murmur heard. Pulmonary:  Effort: Pulmonary effort is normal. No respiratory distress.     Breath sounds: Normal breath sounds. No wheezing.  Musculoskeletal:        General: Normal range of motion.     Cervical back: Neck supple.  Lymphadenopathy:     Cervical: No cervical adenopathy.  Skin:    General: Skin is warm and dry.     Findings: No rash.  Neurological:     General: No focal deficit present.     Mental Status: He is alert and oriented to person, place, and time.     Cranial Nerves: No cranial nerve deficit.     Sensory: No sensory deficit.     Motor: No weakness.     Coordination: Coordination normal.     Gait: Gait normal.     Deep Tendon Reflexes: Reflexes normal.  Psychiatric:        Behavior: Behavior normal.       Assessment & Plan:   Problem List Items Addressed This Visit   None Visit Diagnoses       Status post CVA    -  Primary   Relevant Orders   CBC  with Differential/Platelet   CMP14+EGFR       Patient has follow-up with the stroke follow-up clinic and is going to get follow-up with neurology to discuss possible TEE and possible PFO repair now that he understands a little bit better.  He continues to take an aspirin a day will check blood work today. Follow up plan: Return if symptoms worsen or fail to improve.  Counseling provided for all of the vaccine components Orders Placed This Encounter  Procedures   CBC with Differential/Platelet   CMP14+EGFR    Fonda Levins, MD [facility] 06/16/2023, 3:12 PM

## 2023-06-16 NOTE — Patient Instructions (Addendum)
 Next Steps: Follow up  Instructions: Follow up after hospitalization for stroke  [facility] Stroke [facility]  [address] [place] [address]  Phone: [phone]   Please report to the Stroke [facility] during your appointed time on 06/29/2023 at 9:00 AM.

## 2023-06-17 ENCOUNTER — Telehealth: Payer: Self-pay | Admitting: Family Medicine

## 2023-06-17 LAB — CBC WITH DIFFERENTIAL/PLATELET
Basophils Absolute: 0 10*3/uL (ref 0.0–0.2)
Basos: 1 %
EOS (ABSOLUTE): 0.2 10*3/uL (ref 0.0–0.4)
Eos: 4 %
Hematocrit: 44.5 % (ref 37.5–51.0)
Hemoglobin: 14.7 g/dL (ref 13.0–17.7)
Immature Grans (Abs): 0 10*3/uL (ref 0.0–0.1)
Immature Granulocytes: 1 %
Lymphocytes Absolute: 1.8 10*3/uL (ref 0.7–3.1)
Lymphs: 35 %
MCH: 31.1 pg (ref 26.6–33.0)
MCHC: 33 g/dL (ref 31.5–35.7)
MCV: 94 fL (ref 79–97)
Monocytes Absolute: 0.4 10*3/uL (ref 0.1–0.9)
Monocytes: 8 %
Neutrophils Absolute: 2.6 10*3/uL (ref 1.4–7.0)
Neutrophils: 51 %
Platelets: 219 10*3/uL (ref 150–450)
RBC: 4.72 x10E6/uL (ref 4.14–5.80)
RDW: 12.8 % (ref 11.6–15.4)
WBC: 5.1 10*3/uL (ref 3.4–10.8)

## 2023-06-17 LAB — CMP14+EGFR
ALT: 26 [IU]/L (ref 0–44)
AST: 20 [IU]/L (ref 0–40)
Albumin: 4.4 g/dL (ref 3.8–4.9)
Alkaline Phosphatase: 56 [IU]/L (ref 44–121)
BUN/Creatinine Ratio: 14 (ref 10–24)
BUN: 15 mg/dL (ref 8–27)
Bilirubin Total: 0.2 mg/dL (ref 0.0–1.2)
CO2: 22 mmol/L (ref 20–29)
Calcium: 9 mg/dL (ref 8.6–10.2)
Chloride: 108 mmol/L — ABNORMAL HIGH (ref 96–106)
Creatinine, Ser: 1.07 mg/dL (ref 0.76–1.27)
Globulin, Total: 2.1 g/dL (ref 1.5–4.5)
Glucose: 63 mg/dL — ABNORMAL LOW (ref 70–99)
Potassium: 4.1 mmol/L (ref 3.5–5.2)
Sodium: 144 mmol/L (ref 134–144)
Total Protein: 6.5 g/dL (ref 6.0–8.5)
eGFR: 79 mL/min/{1.73_m2} (ref >59)

## 2023-06-17 NOTE — Telephone Encounter (Signed)
 Pt brought paperwork in for disability to appt 2/6 Form fee $29 needs to be paid before completed [facility] for patient to return call.

## 2023-06-20 NOTE — Telephone Encounter (Signed)
 Tilton Fontan dropped off FMLA/Disability forms to be completed and signed.  Form Fee Paid? (Y/N)    yes        If NO, form is placed on front office manager desk to hold until payment received. If YES, then form will be placed in the RX/HH Nurse Coordinators box for completion.  Form will not be processed until payment is received   Pt came back in on 06-20-2023 & paid for forms to be filled-out. I made he read & sign our form, so he is aware.

## 2023-06-23 ENCOUNTER — Encounter: Payer: Self-pay | Admitting: Family Medicine

## 2023-06-27 NOTE — Telephone Encounter (Signed)
 Please advise.   Copied from CRM [id number]. Topic: General - Other >> Jun 27, 2023  3:59 PM Suzette B wrote: Reason for CRM: Patient stated he had dropped off some forms at the office around the 6th of February and paid for them on the 10th of February for disability and wanted to know if someone can call him to let him know if they are ready or not. Please call patient at [phone]

## 2023-06-28 NOTE — Telephone Encounter (Signed)
 LMOVM paperwork is on PCP's desk

## 2023-06-29 NOTE — Telephone Encounter (Signed)
Pt picked up paperwork today.

## 2023-08-16 DIAGNOSIS — N200 Calculus of kidney: Secondary | ICD-10-CM | POA: Diagnosis not present

## 2023-08-16 DIAGNOSIS — M545 Low back pain, unspecified: Secondary | ICD-10-CM | POA: Diagnosis not present

## 2023-08-23 DIAGNOSIS — K3189 Other diseases of stomach and duodenum: Secondary | ICD-10-CM | POA: Diagnosis not present

## 2023-08-23 DIAGNOSIS — N200 Calculus of kidney: Secondary | ICD-10-CM | POA: Diagnosis not present

## 2023-08-29 DIAGNOSIS — N200 Calculus of kidney: Secondary | ICD-10-CM | POA: Diagnosis not present

## 2023-10-31 DIAGNOSIS — R3129 Other microscopic hematuria: Secondary | ICD-10-CM | POA: Diagnosis not present

## 2023-10-31 DIAGNOSIS — N400 Enlarged prostate without lower urinary tract symptoms: Secondary | ICD-10-CM | POA: Diagnosis not present

## 2023-10-31 DIAGNOSIS — N200 Calculus of kidney: Secondary | ICD-10-CM | POA: Diagnosis not present

## 2023-11-02 ENCOUNTER — Encounter: Payer: Self-pay | Admitting: Gastroenterology

## 2023-12-28 ENCOUNTER — Encounter: Payer: Self-pay | Admitting: Gastroenterology

## 2023-12-28 DIAGNOSIS — N400 Enlarged prostate without lower urinary tract symptoms: Secondary | ICD-10-CM | POA: Diagnosis not present

## 2023-12-28 DIAGNOSIS — D3501 Benign neoplasm of right adrenal gland: Secondary | ICD-10-CM | POA: Diagnosis not present

## 2023-12-28 DIAGNOSIS — N200 Calculus of kidney: Secondary | ICD-10-CM | POA: Diagnosis not present

## 2024-01-18 ENCOUNTER — Encounter: Payer: Self-pay | Admitting: Gastroenterology

## 2024-01-18 ENCOUNTER — Ambulatory Visit (AMBULATORY_SURGERY_CENTER)

## 2024-01-18 VITALS — Ht 73.0 in | Wt 194.8 lb

## 2024-01-18 DIAGNOSIS — Z860100 Personal history of colon polyps, unspecified: Secondary | ICD-10-CM

## 2024-01-18 MED ORDER — ONDANSETRON HCL 4 MG PO TABS: 4.0000 mg | ORAL_TABLET | ORAL | 0 refills | Status: AC

## 2024-01-18 MED ORDER — NA SULFATE-K SULFATE-MG SULF 17.5-3.13-1.6 GM/177ML PO SOLN: 1.0000 | Freq: Once | ORAL | 0 refills | Status: AC

## 2024-01-18 NOTE — Addendum Note (Signed)
 Addended by: GERMAN FRANNE BROCKS on: 01/18/2024 08:51 AM   Modules accepted: Orders

## 2024-01-18 NOTE — Progress Notes (Signed)

## 2024-02-01 ENCOUNTER — Encounter: Payer: Self-pay | Admitting: Gastroenterology

## 2024-02-01 ENCOUNTER — Ambulatory Visit (AMBULATORY_SURGERY_CENTER): Admitting: Gastroenterology

## 2024-02-01 VITALS — BP 105/75 | HR 69 | Temp 97.5°F | Resp 22 | Ht 73.0 in | Wt 194.8 lb

## 2024-02-01 DIAGNOSIS — K5730 Diverticulosis of large intestine without perforation or abscess without bleeding: Secondary | ICD-10-CM

## 2024-02-01 DIAGNOSIS — K635 Polyp of colon: Secondary | ICD-10-CM | POA: Diagnosis not present

## 2024-02-01 DIAGNOSIS — K648 Other hemorrhoids: Secondary | ICD-10-CM | POA: Diagnosis not present

## 2024-02-01 DIAGNOSIS — Z1211 Encounter for screening for malignant neoplasm of colon: Secondary | ICD-10-CM

## 2024-02-01 DIAGNOSIS — D125 Benign neoplasm of sigmoid colon: Secondary | ICD-10-CM

## 2024-02-01 DIAGNOSIS — Z860100 Personal history of colon polyps, unspecified: Secondary | ICD-10-CM

## 2024-02-01 DIAGNOSIS — Z860101 Personal history of adenomatous and serrated colon polyps: Secondary | ICD-10-CM

## 2024-02-01 DIAGNOSIS — D123 Benign neoplasm of transverse colon: Secondary | ICD-10-CM | POA: Diagnosis not present

## 2024-02-01 MED ORDER — SODIUM CHLORIDE 0.9 % IV SOLN: 500.0000 mL | Freq: Once | INTRAVENOUS | Status: DC

## 2024-02-01 NOTE — Progress Notes (Signed)
 History and Physical:  This patient presents for endoscopic testing for: Encounter Diagnosis  Name Primary?   History of colonic polyps Yes    63 year old man here today for surveillance colonoscopy with a history of sessile serrated and adenomatous polyps on in June 2022 colonoscopy done for a positive Cologuard.  Large internal hemorrhoids were found at that time as well. Patient's medical history is described below, and it is noted that he had an acute CVA in January 2025. Reports that his hemorrhoids do not bother him as much as they did the last time I saw him.  He tends to have constipation from time to time Patient is otherwise without complaints or active issues today.   Past Medical History: Past Medical History:  Diagnosis Date   Allergy    CHF (congestive heart failure) (HCC)    Chronic kidney disease    COPD (chronic obstructive pulmonary disease) (HCC)    CVA (cerebral vascular accident) (HCC)    Diabetes mellitus without complication (HCC)    Hypertension    Kidney stones    Myocardial infarction [facility])    Stroke [facility]    patient stated he has had many mini-strokes   Thyroid  disease      Past Surgical History: Past Surgical History:  Procedure Laterality Date   BACK SURGERY     Lower back 2003/2004    Allergies: Allergies  Allergen Reactions   Statins Other (See Comments)    Shortness of breath    Outpatient Meds: Current Outpatient Medications  Medication Sig Dispense Refill   aspirin EC 81 MG tablet Take 81 mg by mouth daily. Swallow whole.     ezetimibe  (ZETIA ) 10 MG tablet Take 1 tablet (10 mg total) by mouth at bedtime. 90 tablet 3   ibuprofen (ADVIL) 200 MG tablet Take 200 mg by mouth every 6 (six) hours as needed.     ondansetron  (ZOFRAN ) 4 MG tablet Take 1 tablet (4 mg total) by mouth as directed. Take one Zofran  4 mg tablet 30-60 minutes before each colonoscopy prep dose 4 tablet 0   potassium citrate (UROCIT-K) 10 MEQ (1080 MG) SR tablet Take  10 mEq by mouth 3 (three) times daily.     Current Facility-Administered Medications  Medication Dose Route Frequency Provider Last Rate Last Admin   0.9 %  sodium chloride  infusion  500 mL Intravenous Once Danis, Victory LITTIE MOULD, MD          ___________________________________________________________________ Objective   Exam:  BP 124/74   Pulse (!) 55   Temp (!) 97.5 F (36.4 C) (Temporal)   Ht 6' 1 (1.854 m)   Wt 194 lb 12.8 oz (88.4 kg)   SpO2 98%   BMI 25.70 kg/m   CV: regular , S1/S2 Resp: clear to auscultation bilaterally, normal RR and effort noted GI: soft, no tenderness, with active bowel sounds.   Assessment: Encounter Diagnosis  Name Primary?   History of colonic polyps Yes     Plan: Colonoscopy   The benefits and risks of the planned procedure(s) were described in detail with the patient or (when appropriate) their health care proxy.  Risks were outlined as including, but not limited to, bleeding, infection, perforation, adverse medication reaction leading to cardiac or pulmonary decompensation, pancreatitis (if ERCP).  The limitation of incomplete mucosal visualization was also discussed.  No guarantees or warranties were given.  The patient is appropriate for an endoscopic procedure in the ambulatory setting.   - Victory Brand, MD

## 2024-02-01 NOTE — Op Note (Signed)
 [facility] Patient Name: Fernando Gardner Procedure Date: 02/01/2024 9:15 AM MRN: [id number] Endoscopist: Victory L. Legrand , MD, [id number] Age: 63 Referring MD:  Date of Birth: 08-07-1960 Gender: Male Account #: [id number] Procedure:                Colonoscopy Indications:              Personal history of colonic polyps                           10 mm SSP and subCM TA June 2022 Medicines:                Monitored Anesthesia Care Procedure:                Pre-Anesthesia Assessment:                           - Prior to the procedure, a History and Physical                            was performed, and patient medications and                            allergies were reviewed. The patient's tolerance of                            previous anesthesia was also reviewed. The risks                            and benefits of the procedure and the sedation                            options and risks were discussed with the patient.                            All questions were answered, and informed consent                            was obtained. Prior Anticoagulants: The patient has                            taken no anticoagulant or antiplatelet agents. ASA                            Grade Assessment: III - A patient with severe                            systemic disease. After reviewing the risks and                            benefits, the patient was deemed in satisfactory                            condition to undergo the procedure.  After obtaining informed consent, the colonoscope                            was passed under direct vision. Throughout the                            procedure, the patient's blood pressure, pulse, and                            oxygen saturations were monitored continuously. The                            Olympus Scope SN: [id number] was introduced through                            the anus and advanced to the the cecum,  identified                            by appendiceal orifice and ileocecal valve. The                            colonoscopy was performed without difficulty. The                            patient tolerated the procedure well. The quality                            of the bowel preparation was excellent. The                            ileocecal valve, appendiceal orifice, and rectum                            were photographed. Scope In: 9:20:28 AM Scope Out: 9:35:57 AM Scope Withdrawal Time: 0 hours 13 minutes 40 seconds  Total Procedure Duration: 0 hours 15 minutes 29 seconds  Findings:                 The perianal and digital rectal examinations were                            normal.                           Repeat examination of right colon under NBI                            performed.                           A diminutive polyp was found in the transverse                            colon. The polyp was semi-sessile. The polyp was  removed with a cold snare. Resection and retrieval                            were complete.                           A 6 mm polyp was found in the sigmoid colon. The                            polyp was sessile. The polyp was removed with a                            cold snare. Resection and retrieval were complete.                           Internal hemorrhoids were found. The hemorrhoids                            were large.                           Diverticula were found in the left colon.                           The exam was otherwise without abnormality on                            direct and retroflexion views. Complications:            No immediate complications. Estimated Blood Loss:     Estimated blood loss was minimal. Impression:               - One diminutive polyp in the transverse colon,                            removed with a cold snare. Resected and retrieved.                           - One 6 mm  polyp in the sigmoid colon, removed with                            a cold snare. Resected and retrieved.                           - Internal hemorrhoids.                           - Diverticulosis in the left colon.                           - The examination was otherwise normal on direct                            and retroflexion views. Recommendation:           - Patient has a contact number  available for                            emergencies. The signs and symptoms of potential                            delayed complications were discussed with the                            patient. Return to normal activities tomorrow.                            Written discharge instructions were provided to the                            patient.                           - Resume previous diet.                           - Continue present medications.                           - Await pathology results.                           - Repeat colonoscopy in 5 years for surveillance.                           - Return to my office as needed if hemorrhoidal                            banding desired. Circe Chilton L. Legrand, MD 02/01/2024 9:40:17 AM This report has been signed electronically.

## 2024-02-01 NOTE — Patient Instructions (Signed)
 Handouts given: Polyps, Hemorrhoids, Diverticulosis Resume previous diet. Continue present medications.  Await pathology results. Repeat colonoscopy in 5 years for surveillance. Return to office as needed is hemorrhoid banding desired.  YOU HAD AN ENDOSCOPIC PROCEDURE TODAY AT THE [facility]:   Refer to the procedure report that was given to you for any specific questions about what was found during the examination.  If the procedure report does not answer your questions, please call your gastroenterologist to clarify.  If you requested that your care partner not be given the details of your procedure findings, then the procedure report has been included in a sealed envelope for you to review at your convenience later.  YOU SHOULD EXPECT: Some feelings of bloating in the abdomen. Passage of more gas than usual.  Walking can help get rid of the air that was put into your GI tract during the procedure and reduce the bloating. If you had a lower endoscopy (such as a colonoscopy or flexible sigmoidoscopy) you may notice spotting of blood in your stool or on the toilet paper. If you underwent a bowel prep for your procedure, you may not have a normal bowel movement for a few days.  Please Note:  You might notice some irritation and congestion in your nose or some drainage.  This is from the oxygen used during your procedure.  There is no need for concern and it should clear up in a day or so.  SYMPTOMS TO REPORT IMMEDIATELY:  Following lower endoscopy (colonoscopy or flexible sigmoidoscopy):  Excessive amounts of blood in the stool  Significant tenderness or worsening of abdominal pains  Swelling of the abdomen that is new, acute  Fever of 100F or higher   For urgent or emergent issues, a gastroenterologist can be reached at any hour by calling [phone]. Do not use MyChart messaging for urgent concerns.    DIET:  We do recommend a small meal at first, but then you may  proceed to your regular diet.  Drink plenty of fluids but you should avoid alcoholic beverages for 24 hours.  ACTIVITY:  You should plan to take it easy for the rest of today and you should NOT DRIVE or use heavy machinery until tomorrow (because of the sedation medicines used during the test).    FOLLOW UP: Our staff will call the number listed on your records the next business day following your procedure.  We will call around 7:15- 8:00 am to check on you and address any questions or concerns that you may have regarding the information given to you following your procedure. If we do not reach you, we will leave a message.     If any biopsies were taken you will be contacted by phone or by letter within the next 1-3 weeks.  Please call us  at [phone] if you have not heard about the biopsies in 3 weeks.    SIGNATURES/CONFIDENTIALITY: You and/or your care partner have signed paperwork which will be entered into your electronic medical record.  These signatures attest to the fact that that the information above on your After Visit Summary has been reviewed and is understood.  Full responsibility of the confidentiality of this discharge information lies with you and/or your care-partner.

## 2024-02-01 NOTE — Progress Notes (Signed)
 Pt's states no medical or surgical changes since previsit or office visit.

## 2024-02-01 NOTE — Progress Notes (Signed)
 Report given to PACU, vss

## 2024-02-02 ENCOUNTER — Telehealth: Payer: Self-pay

## 2024-02-02 NOTE — Telephone Encounter (Signed)
  Follow up Call-     02/01/2024    8:02 AM  Call back number  Post procedure Call Back phone  # [phone]  Permission to leave phone message Yes     Patient questions:  Do you have a fever, pain , or abdominal swelling? No. Pain Score  0 *  Have you tolerated food without any problems? Yes.    Have you been able to return to your normal activities? Yes.    Do you have any questions about your discharge instructions: Diet   No. Medications  No. Follow up visit  No.  Do you have questions or concerns about your Care? No.  Actions: * If pain score is 4 or above: No action needed, pain <4.

## 2024-02-03 LAB — SURGICAL PATHOLOGY

## 2024-02-05 ENCOUNTER — Ambulatory Visit: Payer: Self-pay | Admitting: Gastroenterology

## 2024-04-07 ENCOUNTER — Other Ambulatory Visit: Payer: Self-pay | Admitting: Family Medicine

## 2024-04-09 ENCOUNTER — Encounter: Payer: Self-pay | Admitting: Family Medicine

## 2024-04-09 NOTE — Telephone Encounter (Signed)
 [facility] NTBS last OV 02/16/23 NO RF sent to pharmacy last OV greater than a year

## 2024-04-09 NOTE — Telephone Encounter (Signed)
 Letter sent
